# Patient Record
Sex: Male | Born: 1975 | Race: White | Hispanic: No | Marital: Married | Smoking: Former smoker
Health system: Southern US, Community
[De-identification: ages and names within clinical notes are randomized; demographics above are authoritative.]

## PROBLEM LIST (undated history)

## (undated) HISTORY — PX: WISDOM TOOTH EXTRACTION: SHX21

---

## 1998-11-19 ENCOUNTER — Emergency Department (HOSPITAL_COMMUNITY): Admission: EM | Admit: 1998-11-19 | Discharge: 1998-11-19 | Payer: Self-pay | Admitting: Emergency Medicine

## 1998-12-02 ENCOUNTER — Encounter: Admission: RE | Admit: 1998-12-02 | Discharge: 1998-12-02 | Payer: Self-pay | Admitting: *Deleted

## 2002-01-08 ENCOUNTER — Emergency Department (HOSPITAL_COMMUNITY): Admission: EM | Admit: 2002-01-08 | Discharge: 2002-01-08 | Payer: Self-pay | Admitting: Emergency Medicine

## 2004-09-27 ENCOUNTER — Emergency Department (HOSPITAL_COMMUNITY): Admission: EM | Admit: 2004-09-27 | Discharge: 2004-09-27 | Payer: Self-pay | Admitting: Family Medicine

## 2006-08-21 ENCOUNTER — Emergency Department (HOSPITAL_COMMUNITY): Admission: EM | Admit: 2006-08-21 | Discharge: 2006-08-21 | Payer: Self-pay | Admitting: Emergency Medicine

## 2008-02-06 ENCOUNTER — Emergency Department (HOSPITAL_COMMUNITY): Admission: EM | Admit: 2008-02-06 | Discharge: 2008-02-06 | Payer: Self-pay | Admitting: Emergency Medicine

## 2011-06-07 ENCOUNTER — Ambulatory Visit (INDEPENDENT_AMBULATORY_CARE_PROVIDER_SITE_OTHER): Payer: BC Managed Care – PPO | Admitting: Family Medicine

## 2011-06-07 ENCOUNTER — Encounter: Payer: Self-pay | Admitting: Family Medicine

## 2011-06-07 VITALS — BP 118/78 | HR 76 | Temp 98.0°F | Ht 70.75 in | Wt 225.0 lb

## 2011-06-07 DIAGNOSIS — Z0000 Encounter for general adult medical examination without abnormal findings: Secondary | ICD-10-CM

## 2011-06-07 LAB — CBC WITH DIFFERENTIAL/PLATELET
Basophils Absolute: 0 10*3/uL (ref 0.0–0.1)
Eosinophils Absolute: 0.1 10*3/uL (ref 0.0–0.7)
HCT: 47.4 % (ref 39.0–52.0)
Hemoglobin: 16.4 g/dL (ref 13.0–17.0)
Lymphs Abs: 1.7 10*3/uL (ref 0.7–4.0)
MCHC: 34.7 g/dL (ref 30.0–36.0)
Monocytes Relative: 4.9 % (ref 3.0–12.0)
Neutro Abs: 5.7 10*3/uL (ref 1.4–7.7)
Platelets: 182 10*3/uL (ref 150.0–400.0)
RDW: 13.1 % (ref 11.5–14.6)

## 2011-06-07 LAB — TSH: TSH: 0.9 u[IU]/mL (ref 0.35–5.50)

## 2011-06-07 LAB — POCT URINALYSIS DIPSTICK
Ketones, UA: NEGATIVE
Leukocytes, UA: NEGATIVE
Protein, UA: NEGATIVE
Urobilinogen, UA: 1
pH, UA: 7

## 2011-06-07 LAB — BASIC METABOLIC PANEL
BUN: 12 mg/dL (ref 6–23)
CO2: 27 mEq/L (ref 19–32)
Chloride: 106 mEq/L (ref 96–112)
Creatinine, Ser: 1 mg/dL (ref 0.4–1.5)
Glucose, Bld: 95 mg/dL (ref 70–99)
Potassium: 4.1 mEq/L (ref 3.5–5.1)

## 2011-06-07 LAB — LIPID PANEL
Cholesterol: 153 mg/dL (ref 0–200)
Triglycerides: 45 mg/dL (ref 0.0–149.0)

## 2011-06-07 LAB — HEPATIC FUNCTION PANEL
ALT: 45 U/L (ref 0–53)
AST: 30 U/L (ref 0–37)
Total Protein: 7.2 g/dL (ref 6.0–8.3)

## 2011-06-07 NOTE — Progress Notes (Signed)
  Subjective:    Patient ID: Hector Smith, male    DOB: 01/20/1976, 35 y.o.   MRN: [id number]  HPI 35 yr old male to establish  with [place] and for a cpx. He has several questions. First he has frequent numbness in both hands when he wakes up in the morning, and this then goes away after he gets moving. No swelling or pain or weakness. Second he has had mild intermittent sharp pains in the right lower back for 4 weeks. Aleve helps.    Review of Systems  Constitutional: Negative.   HENT: Negative.   Eyes: Negative.   Respiratory: Negative.   Cardiovascular: Negative.   Gastrointestinal: Negative.   Genitourinary: Negative.   Musculoskeletal: Positive for back pain. Negative for myalgias, joint swelling, arthralgias and gait problem.  Skin: Negative.   Neurological: Positive for numbness. Negative for dizziness, tremors, seizures, syncope, facial asymmetry, speech difficulty, weakness, light-headedness and headaches.  Hematological: Negative.   Psychiatric/Behavioral: Negative.        Objective:   Physical Exam  Constitutional: He is oriented to person, place, and time. He appears well-developed and well-nourished. No distress.  HENT:  Head: Normocephalic and atraumatic.  Right Ear: External ear normal.  Left Ear: External ear normal.  Nose: Nose normal.  Mouth/Throat: Oropharynx is clear and moist. No oropharyngeal exudate.  Eyes: Conjunctivae and EOM are normal. Pupils are equal, round, and reactive to light. Right eye exhibits no discharge. Left eye exhibits no discharge. No scleral icterus.  Neck: Neck supple. No JVD present. No tracheal deviation present. No thyromegaly present.  Cardiovascular: Normal rate, regular rhythm, normal heart sounds and intact distal pulses.  Exam reveals no gallop and no friction rub.   No murmur heard. Pulmonary/Chest: Effort normal and breath sounds normal. No respiratory distress. He has no wheezes. He has no rales. He exhibits no tenderness.    Abdominal: Soft. Bowel sounds are normal. He exhibits no distension and no mass. There is no tenderness. There is no rebound and no guarding.  Genitourinary: Rectum normal, prostate normal and penis normal. Guaiac negative stool. No penile tenderness.  Musculoskeletal: Normal range of motion. He exhibits no edema and no tenderness.  Lymphadenopathy:    He has no cervical adenopathy.  Neurological: He is alert and oriented to person, place, and time. He has normal reflexes. No cranial nerve deficit. He exhibits normal muscle tone. Coordination normal.  Skin: Skin is warm and dry. No rash noted. He is not diaphoretic. No erythema. No pallor.  Psychiatric: He has a normal mood and affect. His behavior is normal. Judgment and thought content normal.          Assessment & Plan:  Get fasting labs. He probably has some mild carpal tunnel syndrome.  Wear wrist splints at bedtime every night. Advised him to get more exercise and lose some weight, and this should help the back pain.

## 2011-06-10 ENCOUNTER — Telehealth: Payer: Self-pay | Admitting: Family Medicine

## 2011-06-10 NOTE — Telephone Encounter (Signed)
Message copied by Baldemar Friday on Fri Jun 10, 2011  4:14 PM ------      Message from: Gershon Crane A      Created: Wed Jun 08, 2011  5:56 PM       normal

## 2011-06-10 NOTE — Telephone Encounter (Signed)
Spoke with pt and gave results. 

## 2014-01-13 ENCOUNTER — Emergency Department (HOSPITAL_COMMUNITY)
Admission: EM | Admit: 2014-01-13 | Discharge: 2014-01-13 | Disposition: A | Discharge status: Home / Self Care | Payer: BC Managed Care – PPO | Source: Home / Self Care | Attending: Emergency Medicine | Admitting: Emergency Medicine

## 2014-01-13 ENCOUNTER — Encounter (HOSPITAL_COMMUNITY): Payer: Self-pay | Admitting: Emergency Medicine

## 2014-01-13 DIAGNOSIS — J069 Acute upper respiratory infection, unspecified: Secondary | ICD-10-CM

## 2014-01-13 MED ORDER — AZITHROMYCIN 250 MG PO TABS: ORAL_TABLET | ORAL | Status: DC

## 2014-01-13 MED ORDER — ALBUTEROL SULFATE HFA 108 (90 BASE) MCG/ACT IN AERS: 1.0000 | INHALATION_SPRAY | Freq: Four times a day (QID) | RESPIRATORY_TRACT | Status: DC | PRN

## 2014-01-13 MED ORDER — HYDROCOD POLST-CHLORPHEN POLST 10-8 MG/5ML PO LQCR: 5.0000 mL | Freq: Two times a day (BID) | ORAL | Status: DC | PRN

## 2014-01-13 MED ORDER — IPRATROPIUM BROMIDE 0.06 % NA SOLN: 2.0000 | Freq: Four times a day (QID) | NASAL | Status: DC

## 2014-01-13 NOTE — ED Provider Notes (Signed)
  Chief Complaint   Chief Complaint  Patient presents with  . URI    History of Present Illness   Hector Smith is a 38 year old male who has had a three-day history of dry cough, chest tightness, wheezing, chest pain when he coughs, trouble breathing, sweats, subjective fever, chills, nasal congestion with clear drainage, headache, ear congestion, sore throat, and loose stools. He denies any nausea or vomiting.  Review of Systems   Other than as noted above, the patient denies any of the following symptoms: Systemic:  No fevers, chills, sweats, or myalgias. Eye:  No redness or discharge. ENT:  No ear pain, headache, nasal congestion, drainage, sinus pressure, or sore throat. Neck:  No neck pain, stiffness, or swollen glands. Lungs:  No cough, sputum production, hemoptysis, wheezing, chest tightness, shortness of breath or chest pain. GI:  No abdominal pain, nausea, vomiting or diarrhea.  PMFSH   Past medical history, family history, social history, meds, and allergies were reviewed.   Physical exam   Vital signs:  BP 137/81  Pulse 86  Temp(Src) 98.9 F (37.2 C) (Oral)  Resp 16  SpO2 96% General:  Alert and oriented.  In no distress.  Skin warm and dry. Eye:  No conjunctival injection or drainage. Lids were normal. ENT:  TMs and canals were normal, without erythema or inflammation.  Nasal mucosa was clear and uncongested, without drainage.  Mucous membranes were moist.  Pharynx was clear with no exudate or drainage.  There were no oral ulcerations or lesions. Neck:  Supple, no adenopathy, tenderness or mass. Lungs:  No respiratory distress.  Lungs were clear to auscultation, without wheezes, rales or rhonchi.  Breath sounds were clear and equal bilaterally.  Heart:  Regular rhythm, without gallops, murmers or rubs. Skin:  Clear, warm, and dry, without rash or lesions.   Assessment     The encounter diagnosis was Viral upper respiratory infection.  Plan    1.  Meds:  The  following meds were prescribed:   Discharge Medication List as of 01/13/2014  6:17 PM    START taking these medications   Details  albuterol (PROVENTIL HFA;VENTOLIN HFA) 108 (90 BASE) MCG/ACT inhaler Inhale 1-2 puffs into the lungs every 6 (six) hours as needed for wheezing or shortness of breath., Starting 01/13/2014, Until Discontinued, Normal    chlorpheniramine-HYDROcodone (TUSSIONEX) 10-8 MG/5ML LQCR Take 5 mLs by mouth every 12 (twelve) hours as needed for cough., Starting 01/13/2014, Until Discontinued, Normal    ipratropium (ATROVENT) 0.06 % nasal spray Place 2 sprays into both nostrils 4 (four) times daily., Starting 01/13/2014, Until Discontinued, Normal    azithromycin (ZITHROMAX Z-PAK) 250 MG tablet Take as directed., Normal       The patient was told not to get the prescription for antibiotic filled unless his respiratory symptoms had persisted for more than 7 to 10 days.  2.  Patient Education/Counseling:  The patient was given appropriate handouts, self care instructions, and instructed in symptomatic relief.  Instructed to get extra fluids, rest, and use a cool mist vaporizer.    3.  Follow up:  The patient was told to follow up here if no better in 3 to 4 days, or sooner if becoming worse in any way, and given some red flag symptoms such as increasing fever, difficulty breathing, chest pain, or persistent vomiting which would prompt immediate return.  Follow up here as needed.      Reuben Likesavid C Levy Wellman, MD 01/13/14 [id number]

## 2014-01-13 NOTE — Discharge Instructions (Signed)
Do not get antibiotic filled unless no better in 2 to 3 days. ° ° °Most upper respiratory infections are caused by viruses and do not require antibiotics.  We try to save the antibiotics for when we really need them to prevent bacteria from developing resistance to them.  Here are a few hints about things that can be done at home to help get over an upper respiratory infection quicker: ° °Get extra sleep and extra fluids.  Get 7 to 9 hours of sleep per night and 6 to 8 glasses of water a day.  Getting extra sleep keeps the immune system from getting run down.  Most people with an upper respiratory infection are a little dehydrated.  The extra fluids also keep the secretions liquified and easier to deal with.  Also, get extra vitamin C.  4000 mg per day is the recommended dose. °For the aches, headache, and fever, acetaminophen or ibuprofen are helpful.  These can be alternated every 4 hours.  People with liver disease should avoid large amounts of acetaminophen, and people with ulcer disease, gastroesophageal reflux, gastritis, congestive heart failure, chronic kidney disease, coronary artery disease and the elderly should avoid ibuprofen. °For nasal congestion try Mucinex-D, or if you're having lots of sneezing or clear nasal drainage use Zyrtec-D. People with high blood pressure can take these if their blood pressure is controlled, if not, it's best to avoid the forms with a "D" (decongestants).  You can use the plain Mucinex, Allegra, Claritin, or Zyrtec even if your blood pressure is not controlled.   °A Saline nasal spray such as Ocean Spray can also help.  You can add a decongestant sprays such as Afrin, but you should not use the decongestant sprays for more than 3 or 4 days since they can be habituating.  Breathe Rite nasal strips can also offer a non-drug alternative treatment to nasal congestion, especially at night. °For people with symptoms of sinusitis, sleeping with your head elevated can be helpful.   For sinus pain, moist, hot compresses to the face may provide some relief.  Many people find that inhaling steam as in a shower or from a pot of steaming water can help. °For any viral infection, zinc containing lozenges such as Cold-Eze or Zicam are helpful.  Zinc helps to fight viral infection.  Hot salt water gargles (8 oz of hot water, 1/2 tsp of table salt, and a pinch of baking soda) can give relief as well as hot beverages such as hot tea.  Sucrets extra strength lozenges will help the sore throat.  °For the cough, take Delsym 2 tsp every 12 hours.  It has also been found recently that Aleve can help control a cough.  The dose is 1 to 2 tablets twice daily with food.  This can be combined with Delsym. (Note, if you are taking ibuprofen, you should not take Aleve as well--take one or the other.) °A cool mist vaporizer will help keep your mucous membranes from drying out.  ° °It's important when you have an upper respiratory infection not to pass the infection to others.  This involves being very careful about the following: ° °Frequent hand washing or use of hand sanitizer, especially after coughing, sneezing, blowing your nose or touching your face, nose or eyes. °Do not shake hands or touch anyone and try to avoid touching surfaces that other people use such as doorknobs, shopping carts, telephones and computer keyboards. °Use tissues and dispose of them properly in a   garbage can or ziplock bag. °Cough into your sleeve. °Do not let others eat or drink after you. ° °It's also important to recognize the signs of serious illness and get evaluated if they occur: °Any respiratory infection that lasts more than 7 to 10 days.  Yellow nasal drainage and sputum are not reliable indicators of a bacterial infection, but if they last for more than 1 week, see your doctor. °Fever and sore throat can indicate strep. °Fever and cough can indicate influenza or pneumonia. °Any kind of severe symptom such as difficulty  breathing, intractable vomiting, or severe pain should prompt you to see a doctor as soon as possible. ° ° °Your body's immune system is really the thing that will get rid of this infection.  Your immune system is comprised of 2 types of specialized cells called T cells and B cells.  T cells coordinate the array of cells in your body that engulf invading bacteria or viruses while B cells orchestrate the production of antibodies that neutralize infection.  Anything we do or any medications we give you, will just strengthen your immune system or help it clear up the infection quicker.  Here are a few helpful hints to improve your immune system to help overcome this illness or to prevent future infections: °· A few vitamins can improve the health of your immune system.  That's why your diet should include plenty of fruits, vegetables, fish, nuts, and whole grains. °· Vitamin A and bet-carotene can increase the cells that fight infections (T cells and B cells).  Vitamin A is abundant in dark greens and orange vegetables such as spinach, greens, sweet potatoes, and carrots. °· Vitamin B6 contributes to the maturation of white blood cells, the cells that fight disease.  Foods with vitamin B6 include cold cereal and bananas. °· Vitamin C is credited with preventing colds because it increases white blood cells and also prevents cellular damage.  Citrus fruits, peaches and green and red bell peppers are all hight in vitamin C. °· Vitamin E is an anti-oxidant that encourages the production of natural killer cells which reject foreign invaders and B cells that produce antibodies.  Foods high in vitamin E include wheat germ, nuts and seeds. °· Foods high in omega-3 fatty acids found in foods like salmon, tuna and mackerel boost your immune system and help cells to engulf and absorb germs. °· Probiotics are good bacteria that increase your T cells.  These can be found in yogurt and are available in supplements such as Culturelle  or Align. °· Moderate exercise increases the strength of your immune system and your ability to recover from illness.  I suggest 3 to 5 moderate intensity 30 minute workouts per week.   °· Sleep is another component of maintaining a strong immune system.  It enables your body to recuperate from the day's activities, stress and work.  My recommendation is to get between 7 and 9 hours of sleep per night. °· If you smoke, try to quit completely or at least cut down.  Drink alcohol only in moderation if at all.  No more than 2 drinks daily for men or 1 for women. °· Get a flu vaccine early in the fall or if you have not gotten one yet, once this illness has run its course.  If you are over 65, a smoker, or an asthmatic, get a pneumococcal vaccine. °· My final recommendation is to maintain a healthy weight.  Excess weight can impair the   immune system by interfering with the way the immune system deals with invading viruses or bacteria. ° ° °

## 2014-01-13 NOTE — ED Notes (Signed)
C/o weak, cough, chest congestion, and fever onset 3 days ago.

## 2014-05-19 ENCOUNTER — Telehealth: Payer: Self-pay | Admitting: Family Medicine

## 2014-05-19 NOTE — Telephone Encounter (Signed)
appt scheduled

## 2014-05-19 NOTE — Telephone Encounter (Signed)
lmvm for pt to cb.

## 2014-05-19 NOTE — Telephone Encounter (Signed)
Pt not seen since 06/07/2011 so he made the cutoff by 17 days. Pt needs cpe by 06/17/14 for his employer. First available is 9/9. Is it OK to work in?

## 2014-05-19 NOTE — Telephone Encounter (Signed)
Okay per Dr. Clent RidgesFry to work in.

## 2014-05-29 ENCOUNTER — Other Ambulatory Visit (INDEPENDENT_AMBULATORY_CARE_PROVIDER_SITE_OTHER): Payer: BC Managed Care – PPO

## 2014-05-29 ENCOUNTER — Ambulatory Visit (INDEPENDENT_AMBULATORY_CARE_PROVIDER_SITE_OTHER): Payer: BC Managed Care – PPO

## 2014-05-29 ENCOUNTER — Ambulatory Visit (INDEPENDENT_AMBULATORY_CARE_PROVIDER_SITE_OTHER): Payer: BC Managed Care – PPO | Admitting: Podiatry

## 2014-05-29 ENCOUNTER — Encounter: Payer: Self-pay | Admitting: Podiatry

## 2014-05-29 VITALS — BP 157/99 | HR 77 | Resp 17

## 2014-05-29 DIAGNOSIS — R52 Pain, unspecified: Secondary | ICD-10-CM

## 2014-05-29 DIAGNOSIS — Z0000 Encounter for general adult medical examination without abnormal findings: Secondary | ICD-10-CM

## 2014-05-29 DIAGNOSIS — M109 Gout, unspecified: Secondary | ICD-10-CM

## 2014-05-29 LAB — CBC WITH DIFFERENTIAL/PLATELET
BASOS ABS: 0 10*3/uL (ref 0.0–0.1)
Basophils Relative: 0.3 % (ref 0.0–3.0)
EOS ABS: 0.4 10*3/uL (ref 0.0–0.7)
EOS PCT: 5.5 % — AB (ref 0.0–5.0)
HCT: 45.7 % (ref 39.0–52.0)
Hemoglobin: 15.5 g/dL (ref 13.0–17.0)
Lymphocytes Relative: 25.3 % (ref 12.0–46.0)
Lymphs Abs: 1.7 10*3/uL (ref 0.7–4.0)
MCHC: 33.9 g/dL (ref 30.0–36.0)
MCV: 87.4 fl (ref 78.0–100.0)
MONO ABS: 0.2 10*3/uL (ref 0.1–1.0)
Monocytes Relative: 2.9 % — ABNORMAL LOW (ref 3.0–12.0)
NEUTROS PCT: 66 % (ref 43.0–77.0)
Neutro Abs: 4.4 10*3/uL (ref 1.4–7.7)
PLATELETS: 226 10*3/uL (ref 150.0–400.0)
RBC: 5.22 Mil/uL (ref 4.22–5.81)
RDW: 13.2 % (ref 11.5–15.5)
WBC: 6.7 10*3/uL (ref 4.0–10.5)

## 2014-05-29 LAB — POCT URINALYSIS DIPSTICK
BILIRUBIN UA: NEGATIVE
Blood, UA: NEGATIVE
GLUCOSE UA: NEGATIVE
KETONES UA: NEGATIVE
LEUKOCYTES UA: NEGATIVE
Nitrite, UA: NEGATIVE
Spec Grav, UA: 1.02
Urobilinogen, UA: 0.2
pH, UA: 7

## 2014-05-29 LAB — C-REACTIVE PROTEIN: CRP: 1 mg/dL — ABNORMAL HIGH (ref <0.60)

## 2014-05-29 LAB — HEPATIC FUNCTION PANEL
ALT: 44 U/L (ref 0–53)
AST: 29 U/L (ref 0–37)
Albumin: 3.9 g/dL (ref 3.5–5.2)
Alkaline Phosphatase: 39 U/L (ref 39–117)
BILIRUBIN DIRECT: 0 mg/dL (ref 0.0–0.3)
BILIRUBIN TOTAL: 0.6 mg/dL (ref 0.2–1.2)
Total Protein: 7.2 g/dL (ref 6.0–8.3)

## 2014-05-29 LAB — LIPID PANEL
CHOLESTEROL: 171 mg/dL (ref 0–200)
HDL: 42.1 mg/dL (ref >39.00)
LDL Cholesterol: 114 mg/dL — ABNORMAL HIGH (ref 0–99)
NonHDL: 128.9
Total CHOL/HDL Ratio: 4
Triglycerides: 73 mg/dL (ref 0.0–149.0)
VLDL: 14.6 mg/dL (ref 0.0–40.0)

## 2014-05-29 LAB — BASIC METABOLIC PANEL
BUN: 16 mg/dL (ref 6–23)
CHLORIDE: 104 meq/L (ref 96–112)
CO2: 28 mEq/L (ref 19–32)
CREATININE: 0.8 mg/dL (ref 0.4–1.5)
Calcium: 9.1 mg/dL (ref 8.4–10.5)
GFR: 109.47 mL/min (ref >60.00)
GLUCOSE: 87 mg/dL (ref 70–99)
Potassium: 3.6 mEq/L (ref 3.5–5.1)
Sodium: 140 mEq/L (ref 135–145)

## 2014-05-29 LAB — URIC ACID: Uric Acid, Serum: 6 mg/dL (ref 4.0–7.8)

## 2014-05-29 LAB — RHEUMATOID FACTOR

## 2014-05-29 LAB — TSH: TSH: 0.29 u[IU]/mL — ABNORMAL LOW (ref 0.35–4.50)

## 2014-05-29 LAB — SEDIMENTATION RATE: SED RATE: 9 mm/h (ref 0–16)

## 2014-05-29 MED ORDER — PREDNISONE 10 MG PO TABS: ORAL_TABLET | ORAL | Status: DC

## 2014-05-29 NOTE — Progress Notes (Signed)
Subjective:     Patient ID: Hector Smith, male   DOB: 11/14/1975, 38 y.o.   MRN: [id number]  HPI patient states she's had a lot of itching of his feet without changes in his and seems to move from foot to foot was swelling but also occurs and also he knows she is flat-footed   Review of Systems  All other systems reviewed and are negative.      Objective:   Physical Exam  Nursing note and vitals reviewed. Constitutional: He is oriented to person, place, and time.  Cardiovascular: Intact distal pulses.   Musculoskeletal: Normal range of motion.  Neurological: He is oriented to person, place, and time.  Skin: Skin is warm.   neurovascular status intact with muscle strength adequate and range of motion subtalar midtarsal joint within normal limits. Patient is not found to have significant discomfort currently with flatfoot deformity noted and digits that are well perfused. I inspected his feet and did not note any skin color changes at the current time     Assessment:     Possibility for some form of inflammatory reaction versus possibility for condition secondary to foot structure or systemic disease    Plan:     H&P and x-rays reviewed. Placed on 12 a steroid radius Dosepak and sent for arthritic profile to rule out inflammatory disease. If we see anything we will call him and we will monitor response to the Medrol Dosepak and decide if it's appropriate to consider other treatment pattern

## 2014-05-29 NOTE — Progress Notes (Signed)
   Subjective:    Patient ID: Hector Smith, male    DOB: 08/24/1976, 38 y.o.   MRN: [id number]  HPI  Pt presents with bilateral itching, pain and swelling in feet. Itching is on tops and bottom of feet, itching comes with swelling. Uses cold water and ice to alleviate. Feet get worse in the afternoon, with pain and makes him feel as if he cannot walk  Review of Systems  All other systems reviewed and are negative.      Objective:   Physical Exam        Assessment & Plan:

## 2014-05-30 LAB — ANA: Anti Nuclear Antibody(ANA): NEGATIVE

## 2014-06-06 ENCOUNTER — Encounter: Payer: Self-pay | Admitting: Family Medicine

## 2014-06-06 ENCOUNTER — Ambulatory Visit (INDEPENDENT_AMBULATORY_CARE_PROVIDER_SITE_OTHER): Payer: BC Managed Care – PPO | Admitting: Family Medicine

## 2014-06-06 VITALS — BP 138/80 | Temp 98.0°F | Ht 71.5 in | Wt 242.0 lb

## 2014-06-06 DIAGNOSIS — Z0000 Encounter for general adult medical examination without abnormal findings: Secondary | ICD-10-CM

## 2014-06-06 MED ORDER — TRIAMCINOLONE ACETONIDE 0.1 % EX CREA: 1.0000 "application " | TOPICAL_CREAM | Freq: Two times a day (BID) | CUTANEOUS | Status: DC

## 2014-06-06 NOTE — Progress Notes (Signed)
   Subjective:    Patient ID: Hector Smith, male    DOB: 09/24/1976, 38 y.o.   MRN: [id number]  HPI  38 yr old male for a cpx. He feels well except for intermittent itching in the tops of his feet and the shins. Sometimes he has red rashes in these areas, sometimes not. He had some swelling and pain in the feet a few months ago and he saw Podiatry for this. He had arthritis type labs done which were unremarkable. He was put on an oral steroid taper and this has helped.    Review of Systems  Constitutional: Negative.   HENT: Negative.   Eyes: Negative.   Respiratory: Negative.   Cardiovascular: Negative.   Gastrointestinal: Negative.   Genitourinary: Negative.   Musculoskeletal: Negative.   Skin: Negative.   Neurological: Negative.   Psychiatric/Behavioral: Negative.        Objective:   Physical Exam  Constitutional: He is oriented to person, place, and time. He appears well-developed and well-nourished. No distress.  HENT:  Head: Normocephalic and atraumatic.  Right Ear: External ear normal.  Left Ear: External ear normal.  Nose: Nose normal.  Mouth/Throat: Oropharynx is clear and moist. No oropharyngeal exudate.  Eyes: Conjunctivae and EOM are normal. Pupils are equal, round, and reactive to light. Right eye exhibits no discharge. Left eye exhibits no discharge. No scleral icterus.  Neck: Neck supple. No JVD present. No tracheal deviation present. No thyromegaly present.  Cardiovascular: Normal rate, regular rhythm, normal heart sounds and intact distal pulses.  Exam reveals no gallop and no friction rub.   No murmur heard. Pulmonary/Chest: Effort normal and breath sounds normal. No respiratory distress. He has no wheezes. He has no rales. He exhibits no tenderness.  Abdominal: Soft. Bowel sounds are normal. He exhibits no distension and no mass. There is no tenderness. There is no rebound and no guarding.  Genitourinary: Rectum normal, prostate normal and penis normal. Guaiac  negative stool. No penile tenderness.  Musculoskeletal: Normal range of motion. He exhibits no edema and no tenderness.  Lymphadenopathy:    He has no cervical adenopathy.  Neurological: He is alert and oriented to person, place, and time. He has normal reflexes. No cranial nerve deficit. He exhibits normal muscle tone. Coordination normal.  Skin: Skin is warm and dry. He is not diaphoretic. No erythema. No pallor.  The dorsal feet are clear but he has a few small scabbed areas on the shins   Psychiatric: He has a normal mood and affect. His behavior is normal. Judgment and thought content normal.          Assessment & Plan:  Well exam. He has some eczema and we will treat this with triamcinolone cream.

## 2014-06-06 NOTE — Progress Notes (Signed)
Pre visit review using our clinic review tool, if applicable. No additional management support is needed unless otherwise documented below in the visit note. 

## 2015-07-02 ENCOUNTER — Encounter: Payer: Self-pay | Admitting: Family Medicine

## 2015-07-02 ENCOUNTER — Ambulatory Visit (INDEPENDENT_AMBULATORY_CARE_PROVIDER_SITE_OTHER): Payer: BLUE CROSS/BLUE SHIELD | Admitting: Family Medicine

## 2015-07-02 VITALS — BP 125/73 | HR 73 | Temp 98.1°F | Ht 71.5 in | Wt 242.0 lb

## 2015-07-02 DIAGNOSIS — J209 Acute bronchitis, unspecified: Secondary | ICD-10-CM

## 2015-07-02 MED ORDER — HYDROCODONE-HOMATROPINE 5-1.5 MG/5ML PO SYRP: 5.0000 mL | ORAL_SOLUTION | ORAL | Status: DC | PRN

## 2015-07-02 MED ORDER — CLARITHROMYCIN 500 MG PO TABS: 500.0000 mg | ORAL_TABLET | Freq: Two times a day (BID) | ORAL | Status: DC

## 2015-07-02 NOTE — Progress Notes (Signed)
Pre visit review using our clinic review tool, if applicable. No additional management support is needed unless otherwise documented below in the visit note. 

## 2015-07-02 NOTE — Progress Notes (Signed)
   Subjective:    Patient ID: Hector Smith, male    DOB: 1976/04/22, 39 y.o.   MRN: [id number]  HPI Here for 3 weeks of chest tightness and a dry cough. No fever. Using Mucinex.    Review of Systems  Constitutional: Negative.   HENT: Positive for congestion. Negative for postnasal drip and sinus pressure.   Eyes: Negative.   Respiratory: Positive for cough and chest tightness. Negative for shortness of breath and wheezing.   Cardiovascular: Negative.        Objective:   Physical Exam  Constitutional: He appears well-developed and well-nourished.  HENT:  Right Ear: External ear normal.  Left Ear: External ear normal.  Nose: Nose normal.  Mouth/Throat: Oropharynx is clear and moist.  Eyes: Conjunctivae are normal.  Neck: No thyromegaly present.  Cardiovascular: Normal rate, regular rhythm, normal heart sounds and intact distal pulses.   Pulmonary/Chest: Effort normal. No respiratory distress. He has no wheezes. He has no rales.  Scattered rhonchi   Lymphadenopathy:    He has no cervical adenopathy.          Assessment & Plan:  Bronchitis, treat with Biaxin

## 2015-07-10 ENCOUNTER — Telehealth: Payer: Self-pay | Admitting: Family Medicine

## 2015-07-10 MED ORDER — AMOXICILLIN-POT CLAVULANATE 875-125 MG PO TABS: 1.0000 | ORAL_TABLET | Freq: Two times a day (BID) | ORAL | Status: DC

## 2015-07-10 MED ORDER — HYDROCODONE-HOMATROPINE 5-1.5 MG/5ML PO SYRP: 5.0000 mL | ORAL_SOLUTION | ORAL | Status: DC | PRN

## 2015-07-10 NOTE — Telephone Encounter (Signed)
Pt seen last week , and pt states last night it all seemed to come back.. Pt continually  Coughing, especially when he lays down, pt states it feel like something in his upper chest and he cannot get out. Pt has no more cough syrup, and 3 pills left that is 2 x /day. Would like to know if you may prescribe additional abx. And or cough med. [facility] [address]

## 2015-07-10 NOTE — Telephone Encounter (Signed)
We will treat him with Augmentin

## 2015-07-10 NOTE — Telephone Encounter (Signed)
Scripts are ready for pick up and I spoke with pt. 

## 2019-07-13 ENCOUNTER — Encounter (HOSPITAL_COMMUNITY): Payer: Self-pay

## 2019-07-13 ENCOUNTER — Ambulatory Visit (HOSPITAL_COMMUNITY)
Admission: EM | Admit: 2019-07-13 | Discharge: 2019-07-13 | Disposition: A | Discharge status: Home / Self Care | Payer: BC Managed Care – PPO | Attending: Emergency Medicine | Admitting: Emergency Medicine

## 2019-07-13 DIAGNOSIS — H6123 Impacted cerumen, bilateral: Secondary | ICD-10-CM | POA: Diagnosis not present

## 2019-07-13 NOTE — Discharge Instructions (Signed)
Your earwax was removed today.  It does not appear that you have an ear infection.    Follow-up with your primary care provider or return here if your ear pain continues or worsens.

## 2019-07-13 NOTE — ED Triage Notes (Signed)
Pt present right ear pain, symptoms started 3-4 days ago. Pt state that his right ear hurt closer to the opening part of the ear.

## 2019-07-13 NOTE — ED Provider Notes (Addendum)
[facility]    CSN: [id number] Arrival date & time: 07/13/19  1338      History   Chief Complaint Chief Complaint  Patient presents with  . Otalgia    Right ear    HPI Hector Smith is a 43 y.o. male.   Patient presents with right ear pain x3 days.  He states both his ears feel stopped up.  He denies fever, chills, sore throat, cough, shortness of breath, or other symptoms.  The history is provided by the patient.    History reviewed. No pertinent past medical history.  There are no active problems to display for this patient.   Past Surgical History:  Procedure Laterality Date  . WISDOM TOOTH EXTRACTION         Home Medications    Prior to Admission medications   Medication Sig Start Date End Date Taking? Authorizing Provider  amoxicillin-clavulanate (AUGMENTIN) 875-125 MG per tablet Take 1 tablet by mouth 2 (two) times daily. 07/10/15   Nelwyn Salisbury, MD  clarithromycin (BIAXIN) 500 MG tablet Take 1 tablet (500 mg total) by mouth 2 (two) times daily. 07/02/15   Nelwyn Salisbury, MD  HYDROcodone-homatropine (HYDROMET) 5-1.5 MG/5ML syrup Take 5 mLs by mouth every 4 (four) hours as needed. 07/10/15   Nelwyn Salisbury, MD    Family History Family History  Problem Relation Age of Onset  . Heart disease Father   . Diabetes Maternal Grandfather     Social History Social History   Tobacco Use  . Smoking status: Former Smoker    Packs/day: 1.00    Years: 15.00    Pack years: 15.00    Types: Cigarettes    Quit date: 06/06/2013    Years since quitting: 6.1  . Smokeless tobacco: Never Used  Substance Use Topics  . Alcohol use: No    Alcohol/week: 0.0 standard drinks  . Drug use: No     Allergies   Patient has no known allergies.   Review of Systems Review of Systems  Constitutional: Negative for chills and fever.  HENT: Positive for ear pain. Negative for congestion, rhinorrhea and sore throat.   Eyes: Negative for pain and visual disturbance.   Respiratory: Negative for cough and shortness of breath.   Cardiovascular: Negative for chest pain and palpitations.  Gastrointestinal: Negative for abdominal pain and vomiting.  Genitourinary: Negative for dysuria and hematuria.  Musculoskeletal: Negative for arthralgias and back pain.  Skin: Negative for color change and rash.  Neurological: Negative for seizures and syncope.  All other systems reviewed and are negative.    Physical Exam Triage Vital Signs ED Triage Vitals  Enc Vitals Group     BP 07/13/19 1425 126/84     Pulse Rate 07/13/19 1425 75     Resp 07/13/19 1425 16     Temp 07/13/19 1425 98.9 F (37.2 C)     Temp Source 07/13/19 1425 Oral     SpO2 --      Weight --      Height --      Head Circumference --      Peak Flow --      Pain Score 07/13/19 1428 5     Pain Loc --      Pain Edu? --      Excl. in GC? --    No data found.  Updated Vital Signs BP 126/84 (BP Location: Right Arm)   Pulse 75   Temp 98.9 F (37.2 C) (  Oral)   Resp 16   Visual Acuity Right Eye Distance:   Left Eye Distance:   Bilateral Distance:    Right Eye Near:   Left Eye Near:    Bilateral Near:     Physical Exam Vitals signs and nursing note reviewed.  Constitutional:      Appearance: He is well-developed.  HENT:     Head: Normocephalic and atraumatic.     Right Ear: There is impacted cerumen.     Left Ear: There is impacted cerumen.     Nose: Nose normal.     Mouth/Throat:     Mouth: Mucous membranes are moist.     Pharynx: Oropharynx is clear.  Eyes:     Conjunctiva/sclera: Conjunctivae normal.  Neck:     Musculoskeletal: Neck supple.  Cardiovascular:     Rate and Rhythm: Normal rate and regular rhythm.     Heart sounds: No murmur.  Pulmonary:     Effort: Pulmonary effort is normal. No respiratory distress.     Breath sounds: Normal breath sounds.  Abdominal:     Palpations: Abdomen is soft.     Tenderness: There is no abdominal tenderness. There is no  guarding or rebound.  Skin:    General: Skin is warm and dry.     Findings: No rash.  Neurological:     Mental Status: He is alert.      UC Treatments / Results  Labs (all labs ordered are listed, but only abnormal results are displayed) Labs Reviewed - No data to display  EKG   Radiology No results found.  Procedures Procedures (including critical care time)  Medications Ordered in UC Medications - No data to display  Initial Impression / Assessment and Plan / UC Course  I have reviewed the triage vital signs and the nursing notes.  Pertinent labs & imaging results that were available during my care of the patient were reviewed by me and considered in my medical decision making (see chart for details).   Bilateral cerumen impaction.  Impaction removed bilaterally via irrigation performed by nurse.  Patient reports symptoms improved after removal.  Instructed him to take Tylenol or ibuprofen as needed for discomfort.  Instructed him to return here or follow-up with his PCP if his ear pain continues or worsens.  Patient agrees to plan of care.       Final Clinical Impressions(s) / UC Diagnoses   Final diagnoses:  Bilateral impacted cerumen     Discharge Instructions     Your earwax was removed today.  It does not appear that you have an ear infection.    Follow-up with your primary care provider or return here if your ear pain continues or worsens.        ED Prescriptions    None     PDMP not reviewed this encounter.   Sharion Balloon, NP 07/13/19 1527    Sharion Balloon, NP 07/13/19 [phone]

## 2019-07-22 ENCOUNTER — Other Ambulatory Visit: Payer: Self-pay

## 2019-07-22 ENCOUNTER — Ambulatory Visit: Payer: BC Managed Care – PPO | Admitting: Family Medicine

## 2019-07-22 ENCOUNTER — Encounter: Payer: Self-pay | Admitting: Family Medicine

## 2019-07-22 VITALS — BP 140/82 | HR 88 | Temp 98.3°F | Ht 71.5 in | Wt 281.0 lb

## 2019-07-22 DIAGNOSIS — L2389 Allergic contact dermatitis due to other agents: Secondary | ICD-10-CM

## 2019-07-22 MED ORDER — TRIAMCINOLONE ACETONIDE 0.1 % EX CREA: 1.0000 "application " | TOPICAL_CREAM | Freq: Two times a day (BID) | CUTANEOUS | 2 refills | Status: DC

## 2019-07-22 NOTE — Progress Notes (Signed)
   Subjective:    Patient ID: Hector Smith, male    DOB: 1976/05/25, 43 y.o.   MRN: [id number]  HPI Here for one year of intermittent itchy rashes on the calves at the level of the tops of his socks. It he scratches these areas they swell a bit and get red. No rashes further down th leg or foot. He uses hypoallergenic detergents and soaps.    Review of Systems  Constitutional: Negative.   Respiratory: Negative.   Cardiovascular: Negative.   Skin: Positive for rash.       Objective:   Physical Exam Constitutional:      Appearance: Normal appearance.  Cardiovascular:     Rate and Rhythm: Normal rate and regular rhythm.     Pulses: Normal pulses.     Heart sounds: Normal heart sounds.  Pulmonary:     Effort: Pulmonary effort is normal.     Breath sounds: Normal breath sounds.  Skin:    Comments: Both lower legs have linear red rings at the level of the tops of his socks   Neurological:     Mental Status: He is alert.           Assessment & Plan:  Contact dermatitis from the elastic in his socks. He can use Triamcinolone cream prn. I suggested he wear footies rather than full length socks.  Alysia Penna, MD

## 2020-10-21 ENCOUNTER — Ambulatory Visit (HOSPITAL_COMMUNITY)
Admission: EM | Admit: 2020-10-21 | Discharge: 2020-10-21 | Disposition: A | Discharge status: Home / Self Care | Payer: BC Managed Care – PPO | Attending: Family Medicine | Admitting: Family Medicine

## 2020-10-21 ENCOUNTER — Encounter (HOSPITAL_COMMUNITY): Payer: Self-pay

## 2020-10-21 ENCOUNTER — Ambulatory Visit (INDEPENDENT_AMBULATORY_CARE_PROVIDER_SITE_OTHER): Payer: BC Managed Care – PPO

## 2020-10-21 DIAGNOSIS — R059 Cough, unspecified: Secondary | ICD-10-CM | POA: Insufficient documentation

## 2020-10-21 DIAGNOSIS — J129 Viral pneumonia, unspecified: Secondary | ICD-10-CM | POA: Diagnosis present

## 2020-10-21 DIAGNOSIS — R0602 Shortness of breath: Secondary | ICD-10-CM | POA: Diagnosis present

## 2020-10-21 DIAGNOSIS — U071 COVID-19: Secondary | ICD-10-CM | POA: Insufficient documentation

## 2020-10-21 DIAGNOSIS — R079 Chest pain, unspecified: Secondary | ICD-10-CM | POA: Diagnosis not present

## 2020-10-21 DIAGNOSIS — Z20822 Contact with and (suspected) exposure to covid-19: Secondary | ICD-10-CM | POA: Diagnosis not present

## 2020-10-21 DIAGNOSIS — R0981 Nasal congestion: Secondary | ICD-10-CM

## 2020-10-21 DIAGNOSIS — R531 Weakness: Secondary | ICD-10-CM

## 2020-10-21 LAB — SARS CORONAVIRUS 2 (TAT 6-24 HRS): SARS Coronavirus 2: POSITIVE — AB

## 2020-10-21 MED ORDER — PREDNISONE 20 MG PO TABS: 40.0000 mg | ORAL_TABLET | Freq: Every day | ORAL | 0 refills | Status: DC

## 2020-10-21 NOTE — ED Provider Notes (Signed)
[facility]   [id number] 10/21/20 Arrival Time: 0859  ASSESSMENT & PLAN:  1. Close exposure to COVID-19 virus   2. Cough   3. SOB (shortness of breath)   4. Pneumonia, viral     I have personally viewed the imaging studies ordered this visit. Bilateral patchy infiltrates.  COVID-19 testing sent. See letter/work note on file for self-isolation guidelines. OTC symptom care as needed.  Discussed CXR results. I suspect he has had COVID with family members positive last week. No respiratory distress. SpO2 stable at 95%. With mild wheezes on exam will begin trial of prednisone as below. Agrees to ED evaluation should he worsen in any way.  Meds ordered this encounter  Medications  . predniSONE (DELTASONE) 20 MG tablet    Sig: Take 2 tablets (40 mg total) by mouth daily.    Dispense:  10 tablet    Refill:  0     Follow-up Information    [facility].   Specialty: Emergency Medicine Why: If symptoms worsen in any way. Contact information: [address] [phone] mc [place] [address] [phone]              Reviewed expectations re: course of current medical issues. Questions answered. Outlined signs and symptoms indicating need for more acute intervention. Understanding verbalized. After Visit Summary given.   SUBJECTIVE: History from: patient. Hector Smith is a 45 y.o. male who presents with worries regarding COVID-19. Known COVID-19 contact: family members last week. Recent travel: none. Reports: non-prod cough, fatigue, body aches, ST; "feel very weak". Reports "chest congestion" getting worse. Subj fever a few days ago; last week up to 104 F. No current SOB. No assoc CP. Tolerating PO intake but overall decreased.  Social History   Tobacco Use  Smoking Status Former Smoker  . Packs/day: 1.00  . Years: 15.00  . Pack years: 15.00  . Types: Cigarettes  . Quit date: 06/06/2013  . Years  since quitting: 7.3  Smokeless Tobacco Never Used      OBJECTIVE:  Vitals:   10/21/20 1055 10/21/20 1056 10/21/20 1106  BP: (!) 173/96    Pulse: (!) 108    Resp: (!) 24    Temp: 99 F (37.2 C)    TempSrc: Oral    SpO2: 95%  95%  Weight:  127 kg   Height:  5\' 11"  (1.803 m)     Slight tachycardia noted. Is tachypneic.  General appearance: alert; no distress; appears fatigued Eyes: PERRLA; EOMI; conjunctiva normal HENT: [place]; AT; with nasal congestion Neck: supple  Lungs: speaks full sentences without difficulty; unlabored; mild bilat exp wheezing present Extremities: no edema Skin: warm and dry Neurologic: normal gait Psychological: alert and cooperative; normal mood and affect  Labs:  Labs Reviewed  SARS CORONAVIRUS 2 (TAT 6-24 HRS)    Imaging: DG Chest 2 View  Result Date: 10/21/2020 CLINICAL DATA:  Cough, congestion, and weakness since Christmas. EXAM: CHEST - 2 VIEW COMPARISON:  None. FINDINGS: The heart size and mediastinal contours are within normal limits. Normal pulmonary vascularity. Mild interstitial opacity in the left lower lobe and inferior right upper lobe. No focal consolidation, pleural effusion, or pneumothorax. No acute osseous abnormality. IMPRESSION: 1. Mild interstitial opacity in the left lower lobe and inferior right upper lobe, suspicious for atypical infection. Electronically Signed   By: 05-09-1973 M.D.   On: 10/21/2020 11:22    No Known Allergies  History reviewed. No pertinent past medical history.  Social History   Socioeconomic History  . Marital status: Married    Spouse name: Not on file  . Number of children: Not on file  . Years of education: Not on file  . Highest education level: Not on file  Occupational History  . Not on file  Tobacco Use  . Smoking status: Former Smoker    Packs/day: 1.00    Years: 15.00    Pack years: 15.00    Types: Cigarettes    Quit date: 06/06/2013    Years since quitting: 7.3  . Smokeless  tobacco: Never Used  Substance and Sexual Activity  . Alcohol use: No    Alcohol/week: 0.0 standard drinks  . Drug use: No  . Sexual activity: Not on file  Other Topics Concern  . Not on file  Social History Narrative  . Not on file   Social Determinants of Health   Financial Resource Strain: Not on file  Food Insecurity: Not on file  Transportation Needs: Not on file  Physical Activity: Not on file  Stress: Not on file  Social Connections: Not on file  Intimate Partner Violence: Not on file   Family History  Problem Relation Age of Onset  . Heart disease Father   . Diabetes Maternal Grandfather    Past Surgical History:  Procedure Laterality Date  . WISDOM TOOTH EXTRACTION       Mardella Layman, MD 10/21/20 1257

## 2020-10-21 NOTE — Discharge Instructions (Addendum)
You have been tested for COVID-19 today. °If your test returns positive, you will receive a phone call from [facility] regarding your results. °Negative test results are not called. °Both positive and negative results area always visible on MyChart. °If you do not have a MyChart account, sign up instructions are provided in your discharge papers. °Please do not hesitate to contact us should you have questions or concerns. ° °

## 2020-10-21 NOTE — ED Triage Notes (Signed)
Pt c/o non-productive cough, congestion, sore throat, general weakness onset 12/25. Here today b/c weakness has worsened "can't hardly get out of bed". Also reports "chest congestion" pain to mid upper back. Describes CP as something sitting on chest  Reports fever with Tmax of 104 last week, now resolved.  Also c/o lightheadedness with sweating when standing, moving, nausea today and yesterday with dry heaves. EKG performed and given to Dr. Tracie Harrier who advised pt can be evaluated here. CXR ordered.

## 2021-04-05 ENCOUNTER — Other Ambulatory Visit: Payer: Self-pay

## 2021-04-05 ENCOUNTER — Ambulatory Visit (INDEPENDENT_AMBULATORY_CARE_PROVIDER_SITE_OTHER): Payer: BC Managed Care – PPO | Admitting: Family Medicine

## 2021-04-05 ENCOUNTER — Encounter: Payer: Self-pay | Admitting: Family Medicine

## 2021-04-05 VITALS — BP 130/94 | HR 94 | Temp 98.7°F | Ht 70.5 in | Wt 298.1 lb

## 2021-04-05 DIAGNOSIS — Z0000 Encounter for general adult medical examination without abnormal findings: Secondary | ICD-10-CM

## 2021-04-05 MED ORDER — SILDENAFIL CITRATE 100 MG PO TABS: 100.0000 mg | ORAL_TABLET | Freq: Every day | ORAL | 5 refills | Status: DC | PRN

## 2021-04-05 NOTE — Progress Notes (Signed)
   Subjective:    Patient ID: Hector Smith, male    DOB: 07/11/1976, 45 y.o.   MRN: [id number]  HPI Here for a well exam. He feels well except he complains of trouble getting and maintaining erections. His libido is normal. He knows he is overweight, and he is trying to lose some weight.    Review of Systems  Constitutional: Negative.   HENT: Negative.    Eyes: Negative.   Respiratory: Negative.    Cardiovascular: Negative.   Gastrointestinal: Negative.   Genitourinary: Negative.   Musculoskeletal: Negative.   Skin: Negative.   Neurological: Negative.   Psychiatric/Behavioral: Negative.        Objective:   Physical Exam Constitutional:      General: He is not in acute distress.    Appearance: He is well-developed. He is obese. He is not diaphoretic.  HENT:     Head: Normocephalic and atraumatic.     Right Ear: External ear normal.     Left Ear: External ear normal.     Nose: Nose normal.     Mouth/Throat:     Pharynx: No oropharyngeal exudate.  Eyes:     General: No scleral icterus.       Right eye: No discharge.        Left eye: No discharge.     Conjunctiva/sclera: Conjunctivae normal.     Pupils: Pupils are equal, round, and reactive to light.  Neck:     Thyroid: No thyromegaly.     Vascular: No JVD.     Trachea: No tracheal deviation.  Cardiovascular:     Rate and Rhythm: Normal rate and regular rhythm.     Heart sounds: Normal heart sounds. No murmur heard.   No friction rub. No gallop.  Pulmonary:     Effort: Pulmonary effort is normal. No respiratory distress.     Breath sounds: Normal breath sounds. No wheezing or rales.  Chest:     Chest wall: No tenderness.  Abdominal:     General: Bowel sounds are normal. There is no distension.     Palpations: Abdomen is soft. There is no mass.     Tenderness: There is no abdominal tenderness. There is no guarding or rebound.  Genitourinary:    Penis: Normal. No tenderness.      Testes: Normal.  Musculoskeletal:         General: No tenderness. Normal range of motion.     Cervical back: Neck supple.  Lymphadenopathy:     Cervical: No cervical adenopathy.  Skin:    General: Skin is warm and dry.     Coloration: Skin is not pale.     Findings: No erythema or rash.  Neurological:     Mental Status: He is alert and oriented to person, place, and time.     Cranial Nerves: No cranial nerve deficit.     Motor: No abnormal muscle tone.     Coordination: Coordination normal.     Deep Tendon Reflexes: Reflexes are normal and symmetric. Reflexes normal.  Psychiatric:        Behavior: Behavior normal.        Thought Content: Thought content normal.        Judgment: Judgment normal.          Assessment & Plan:  Well exam. We discussed diet and exercise. Get fasting labs soon, including a testosterone level. Try Viagra as needed.  Gershon Crane, MD

## 2021-04-12 ENCOUNTER — Other Ambulatory Visit: Payer: Self-pay

## 2021-04-12 ENCOUNTER — Other Ambulatory Visit (INDEPENDENT_AMBULATORY_CARE_PROVIDER_SITE_OTHER): Payer: BC Managed Care – PPO

## 2021-04-12 DIAGNOSIS — Z0000 Encounter for general adult medical examination without abnormal findings: Secondary | ICD-10-CM

## 2021-04-12 LAB — HEPATIC FUNCTION PANEL
ALT: 57 U/L — ABNORMAL HIGH (ref 0–53)
AST: 31 U/L (ref 0–37)
Albumin: 4.3 g/dL (ref 3.5–5.2)
Alkaline Phosphatase: 38 U/L — ABNORMAL LOW (ref 39–117)
Bilirubin, Direct: 0.1 mg/dL (ref 0.0–0.3)
Total Bilirubin: 0.5 mg/dL (ref 0.2–1.2)
Total Protein: 6.7 g/dL (ref 6.0–8.3)

## 2021-04-12 LAB — LIPID PANEL
Cholesterol: 196 mg/dL (ref 0–200)
HDL: 45.8 mg/dL (ref >39.00)
LDL Cholesterol: 120 mg/dL — ABNORMAL HIGH (ref 0–99)
NonHDL: 150.37
Total CHOL/HDL Ratio: 4
Triglycerides: 152 mg/dL — ABNORMAL HIGH (ref 0.0–149.0)
VLDL: 30.4 mg/dL (ref 0.0–40.0)

## 2021-04-12 LAB — CBC WITH DIFFERENTIAL/PLATELET
Basophils Absolute: 0.1 10*3/uL (ref 0.0–0.1)
Basophils Relative: 0.9 % (ref 0.0–3.0)
Eosinophils Absolute: 0.2 10*3/uL (ref 0.0–0.7)
Eosinophils Relative: 3.1 % (ref 0.0–5.0)
HCT: 46 % (ref 39.0–52.0)
Hemoglobin: 16.2 g/dL (ref 13.0–17.0)
Lymphocytes Relative: 39.1 % (ref 12.0–46.0)
Lymphs Abs: 2.3 10*3/uL (ref 0.7–4.0)
MCHC: 35.3 g/dL (ref 30.0–36.0)
MCV: 88.3 fl (ref 78.0–100.0)
Monocytes Absolute: 0.3 10*3/uL (ref 0.1–1.0)
Monocytes Relative: 5.9 % (ref 3.0–12.0)
Neutro Abs: 2.9 10*3/uL (ref 1.4–7.7)
Neutrophils Relative %: 51 % (ref 43.0–77.0)
Platelets: 164 10*3/uL (ref 150.0–400.0)
RBC: 5.21 Mil/uL (ref 4.22–5.81)
RDW: 13.1 % (ref 11.5–15.5)
WBC: 5.8 10*3/uL (ref 4.0–10.5)

## 2021-04-12 LAB — BASIC METABOLIC PANEL
BUN: 16 mg/dL (ref 6–23)
CO2: 29 mEq/L (ref 19–32)
Calcium: 9.4 mg/dL (ref 8.4–10.5)
Chloride: 103 mEq/L (ref 96–112)
Creatinine, Ser: 1.03 mg/dL (ref 0.40–1.50)
GFR: 87.33 mL/min (ref >60.00)
Glucose, Bld: 107 mg/dL — ABNORMAL HIGH (ref 70–99)
Potassium: 4.4 mEq/L (ref 3.5–5.1)
Sodium: 139 mEq/L (ref 135–145)

## 2021-04-12 LAB — T4, FREE: Free T4: 0.61 ng/dL (ref 0.60–1.60)

## 2021-04-12 LAB — HEMOGLOBIN A1C: Hgb A1c MFr Bld: 5.6 % (ref 4.6–6.5)

## 2021-04-12 LAB — TESTOSTERONE: Testosterone: 176.19 ng/dL — ABNORMAL LOW (ref 300.00–890.00)

## 2021-04-12 LAB — TSH: TSH: 2.68 u[IU]/mL (ref 0.35–4.50)

## 2021-04-12 LAB — T3, FREE: T3, Free: 4.2 pg/mL (ref 2.3–4.2)

## 2021-06-29 ENCOUNTER — Telehealth: Payer: Self-pay | Admitting: Family Medicine

## 2021-06-29 DIAGNOSIS — E291 Testicular hypofunction: Secondary | ICD-10-CM

## 2021-06-29 NOTE — Telephone Encounter (Signed)
We spoke of him taking testosterone. Have him ask his insurance company what types of testosterone replacement they will cover

## 2021-06-29 NOTE — Telephone Encounter (Signed)
Last office visit 03/2021.

## 2021-06-29 NOTE — Telephone Encounter (Signed)
PT is calling in regards to his last apt with Dr.Fry. He would like to find out what the supplement was that they had talked about that is suppose to help him with the low T.

## 2021-06-30 ENCOUNTER — Telehealth: Payer: Self-pay | Admitting: Family Medicine

## 2021-06-30 DIAGNOSIS — E291 Testicular hypofunction: Secondary | ICD-10-CM | POA: Insufficient documentation

## 2021-06-30 MED ORDER — TESTOSTERONE 20.25 MG/1.25GM (1.62%) TD GEL: 4.0000 | Freq: Every day | TRANSDERMAL | 5 refills | Status: DC

## 2021-06-30 NOTE — Telephone Encounter (Signed)
Error

## 2021-06-30 NOTE — Telephone Encounter (Signed)
Patient called stating he spoke with insurance and insurance stated Dr.Fry would have to fill out a form for insurance and send it in to see if insurance will cover the cost of drug for the low T      Good callback number for questions is [phone]

## 2021-06-30 NOTE — Telephone Encounter (Signed)
Spoke with patient.   He will call his insurance company to inquire about Testosterone coverage then will send my chart message with information.

## 2021-06-30 NOTE — Telephone Encounter (Signed)
Called [employer], Testosterone is covered only for replacement therapy, not ED.    Covered medication is Testosterone 2%, Androgel 1.62%, Testosterone topical solution,  and Androderm

## 2021-06-30 NOTE — Addendum Note (Signed)
Addended by: Gershon Crane A on: 06/30/2021 05:08 PM   Modules accepted: Orders

## 2021-06-30 NOTE — Telephone Encounter (Signed)
I sent in for the Androgel pump. Let the insurance company know that this is for hypogonadism (not ED)

## 2021-07-01 NOTE — Telephone Encounter (Signed)
Spoke with pt pharmacy, advised of the diagnosis of pt Rx for Androgel, pharmacist state that insurance approved Rx but pt will need to pay $5 out of pocket cost, pt verbalized understanding

## 2022-01-04 ENCOUNTER — Other Ambulatory Visit: Payer: Self-pay | Admitting: Family Medicine

## 2022-01-04 NOTE — Telephone Encounter (Signed)
Last refill- 06/30/2021 ?Last OV-04/05/2021 ? ?No future OV scheduled. ?

## 2022-05-12 ENCOUNTER — Telehealth: Payer: Self-pay | Admitting: Family Medicine

## 2022-05-12 NOTE — Telephone Encounter (Signed)
Pt has a cpe sch for 06-06-2022 and no longer want testosterone gel and  would new rx testosterone he will like to give himself the injections  [id number] - [place], [place] - [address] AT [facility] [address] Phone:  [phone]  Fax:  [phone]

## 2022-05-13 NOTE — Telephone Encounter (Signed)
He will need to wait until the exam day for the new RX because insurance will want [place] to check another blood level

## 2022-05-13 NOTE — Telephone Encounter (Signed)
Pharmacy updated.

## 2022-05-13 NOTE — Telephone Encounter (Signed)
Pt was advised of Dr Clent Ridges recommendation, verbalized understanding

## 2022-06-06 ENCOUNTER — Ambulatory Visit (INDEPENDENT_AMBULATORY_CARE_PROVIDER_SITE_OTHER): Payer: BC Managed Care – PPO | Admitting: Family Medicine

## 2022-06-06 ENCOUNTER — Encounter: Payer: Self-pay | Admitting: Family Medicine

## 2022-06-06 VITALS — BP 138/96 | HR 88 | Temp 98.5°F | Ht 70.5 in | Wt 295.0 lb

## 2022-06-06 DIAGNOSIS — Z0000 Encounter for general adult medical examination without abnormal findings: Secondary | ICD-10-CM

## 2022-06-06 DIAGNOSIS — E291 Testicular hypofunction: Secondary | ICD-10-CM

## 2022-06-06 LAB — CBC WITH DIFFERENTIAL/PLATELET
Basophils Absolute: 0.1 10*3/uL (ref 0.0–0.1)
Basophils Relative: 2.3 % (ref 0.0–3.0)
Eosinophils Absolute: 0.1 10*3/uL (ref 0.0–0.7)
Eosinophils Relative: 2 % (ref 0.0–5.0)
HCT: 51.7 % (ref 39.0–52.0)
Hemoglobin: 17.9 g/dL — ABNORMAL HIGH (ref 13.0–17.0)
Lymphocytes Relative: 30 % (ref 12.0–46.0)
Lymphs Abs: 2 10*3/uL (ref 0.7–4.0)
MCHC: 34.6 g/dL (ref 30.0–36.0)
MCV: 90.4 fl (ref 78.0–100.0)
Monocytes Absolute: 0.5 10*3/uL (ref 0.1–1.0)
Monocytes Relative: 6.9 % (ref 3.0–12.0)
Neutro Abs: 3.9 10*3/uL (ref 1.4–7.7)
Neutrophils Relative %: 58.8 % (ref 43.0–77.0)
Platelets: 182 10*3/uL (ref 150.0–400.0)
RBC: 5.72 Mil/uL (ref 4.22–5.81)
RDW: 13.5 % (ref 11.5–15.5)
WBC: 6.6 10*3/uL (ref 4.0–10.5)

## 2022-06-06 LAB — BASIC METABOLIC PANEL
BUN: 13 mg/dL (ref 6–23)
CO2: 29 mEq/L (ref 19–32)
Calcium: 9.6 mg/dL (ref 8.4–10.5)
Chloride: 101 mEq/L (ref 96–112)
Creatinine, Ser: 1.02 mg/dL (ref 0.40–1.50)
GFR: 87.65 mL/min (ref >60.00)
Glucose, Bld: 102 mg/dL — ABNORMAL HIGH (ref 70–99)
Potassium: 4.6 mEq/L (ref 3.5–5.1)
Sodium: 139 mEq/L (ref 135–145)

## 2022-06-06 LAB — HEPATIC FUNCTION PANEL
ALT: 54 U/L — ABNORMAL HIGH (ref 0–53)
AST: 29 U/L (ref 0–37)
Albumin: 4.7 g/dL (ref 3.5–5.2)
Alkaline Phosphatase: 38 U/L — ABNORMAL LOW (ref 39–117)
Bilirubin, Direct: 0.1 mg/dL (ref 0.0–0.3)
Total Bilirubin: 0.5 mg/dL (ref 0.2–1.2)
Total Protein: 7.4 g/dL (ref 6.0–8.3)

## 2022-06-06 LAB — LIPID PANEL
Cholesterol: 197 mg/dL (ref 0–200)
HDL: 47.8 mg/dL (ref >39.00)
LDL Cholesterol: 128 mg/dL — ABNORMAL HIGH (ref 0–99)
NonHDL: 149.29
Total CHOL/HDL Ratio: 4
Triglycerides: 107 mg/dL (ref 0.0–149.0)
VLDL: 21.4 mg/dL (ref 0.0–40.0)

## 2022-06-06 LAB — TSH: TSH: 1.56 u[IU]/mL (ref 0.35–5.50)

## 2022-06-06 LAB — HEMOGLOBIN A1C: Hgb A1c MFr Bld: 5.6 % (ref 4.6–6.5)

## 2022-06-06 LAB — TESTOSTERONE: Testosterone: 161.71 ng/dL — ABNORMAL LOW (ref 300.00–890.00)

## 2022-06-06 LAB — PSA: PSA: 2.62 ng/mL (ref 0.10–4.00)

## 2022-06-06 MED ORDER — SILDENAFIL CITRATE 100 MG PO TABS: 100.0000 mg | ORAL_TABLET | Freq: Every day | ORAL | 5 refills | Status: DC | PRN

## 2022-06-06 MED ORDER — TESTOSTERONE CYPIONATE 200 MG/ML IM SOLN: 200.0000 mg | INTRAMUSCULAR | 1 refills | Status: DC

## 2022-06-06 MED ORDER — "LUER LOCK SAFETY SYRINGES 21G X 1-1/2"" 3 ML MISC": 1.0000 | 2 refills | Status: DC

## 2022-06-06 NOTE — Progress Notes (Signed)
   Subjective:    Patient ID: Hector Smith, male    DOB: July 10, 1976, 46 y.o.   MRN: [id number]  HPI Here for a well exam. He feels fine, but he says that his insurance company prefers him to use testosterone shots rather than the gel.    Review of Systems  Constitutional: Negative.   HENT: Negative.    Eyes: Negative.   Respiratory: Negative.    Cardiovascular: Negative.   Gastrointestinal: Negative.   Genitourinary: Negative.   Musculoskeletal: Negative.   Skin: Negative.   Neurological: Negative.   Psychiatric/Behavioral: Negative.         Objective:   Physical Exam Constitutional:      General: He is not in acute distress.    Appearance: He is well-developed. He is obese. He is not diaphoretic.  HENT:     Head: Normocephalic and atraumatic.     Right Ear: External ear normal.     Left Ear: External ear normal.     Nose: Nose normal.     Mouth/Throat:     Pharynx: No oropharyngeal exudate.  Eyes:     General: No scleral icterus.       Right eye: No discharge.        Left eye: No discharge.     Conjunctiva/sclera: Conjunctivae normal.     Pupils: Pupils are equal, round, and reactive to light.  Neck:     Thyroid: No thyromegaly.     Vascular: No JVD.     Trachea: No tracheal deviation.  Cardiovascular:     Rate and Rhythm: Normal rate and regular rhythm.     Heart sounds: Normal heart sounds. No murmur heard.    No friction rub. No gallop.  Pulmonary:     Effort: Pulmonary effort is normal. No respiratory distress.     Breath sounds: Normal breath sounds. No wheezing or rales.  Chest:     Chest wall: No tenderness.  Abdominal:     General: Bowel sounds are normal. There is no distension.     Palpations: Abdomen is soft. There is no mass.     Tenderness: There is no abdominal tenderness. There is no guarding or rebound.  Genitourinary:    Penis: Normal. No tenderness.      Testes: Normal.  Musculoskeletal:        General: No tenderness. Normal range of  motion.     Cervical back: Neck supple.  Lymphadenopathy:     Cervical: No cervical adenopathy.  Skin:    General: Skin is warm and dry.     Coloration: Skin is not pale.     Findings: No erythema or rash.  Neurological:     Mental Status: He is alert and oriented to person, place, and time.     Cranial Nerves: No cranial nerve deficit.     Motor: No abnormal muscle tone.     Coordination: Coordination normal.     Deep Tendon Reflexes: Reflexes are normal and symmetric. Reflexes normal.  Psychiatric:        Behavior: Behavior normal.        Thought Content: Thought content normal.        Judgment: Judgment normal.           Assessment & Plan:  Well exam. We discussed diet and exercise. Get fasting labs. We will switch from the gel to testosterone cypionate 200 mg per week.  Gershon Crane, MD

## 2022-06-08 NOTE — Addendum Note (Signed)
Addended by: Johnella Moloney on: 06/08/2022 09:21 AM   Modules accepted: Orders

## 2022-07-07 IMAGING — DX DG CHEST 2V
2 series · 2 of 2 positions shown · non-contrast
Comparison: None.

CLINICAL DATA: Cough, congestion, and weakness since [REDACTED].

EXAM:
CHEST - 2 VIEW

[chest pa]
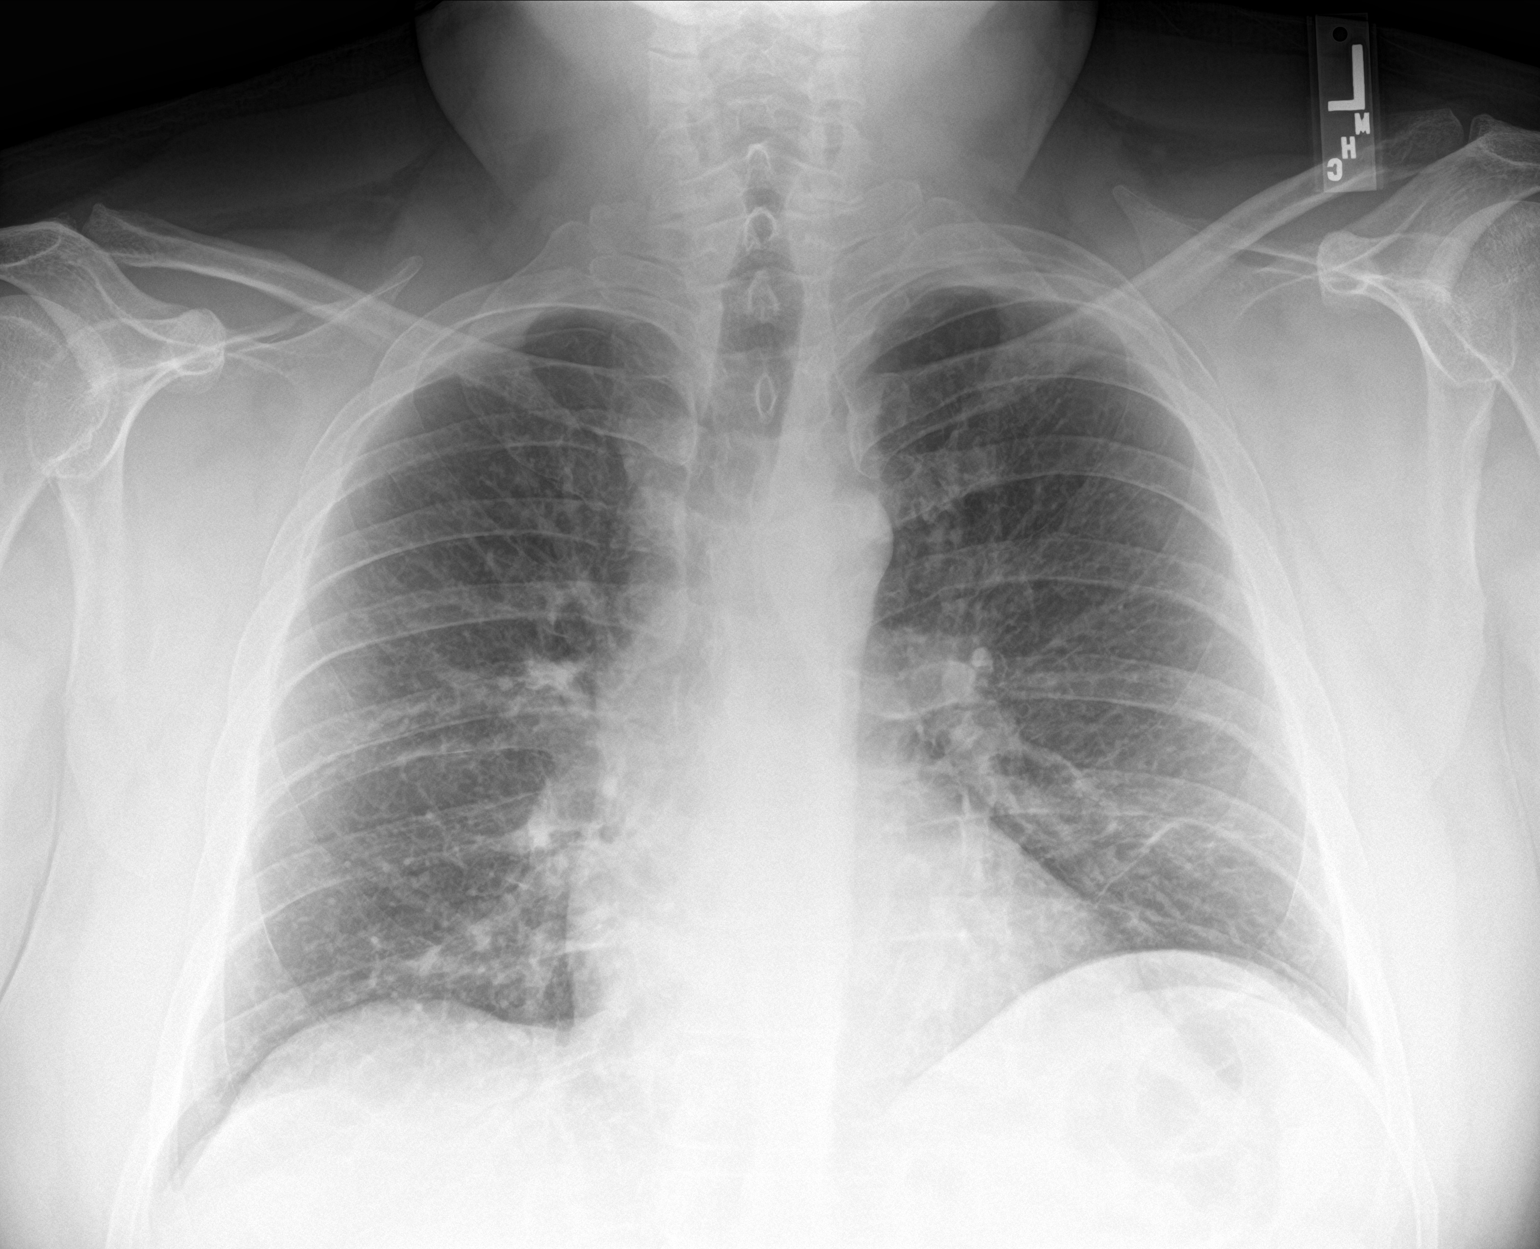

[chest lat]
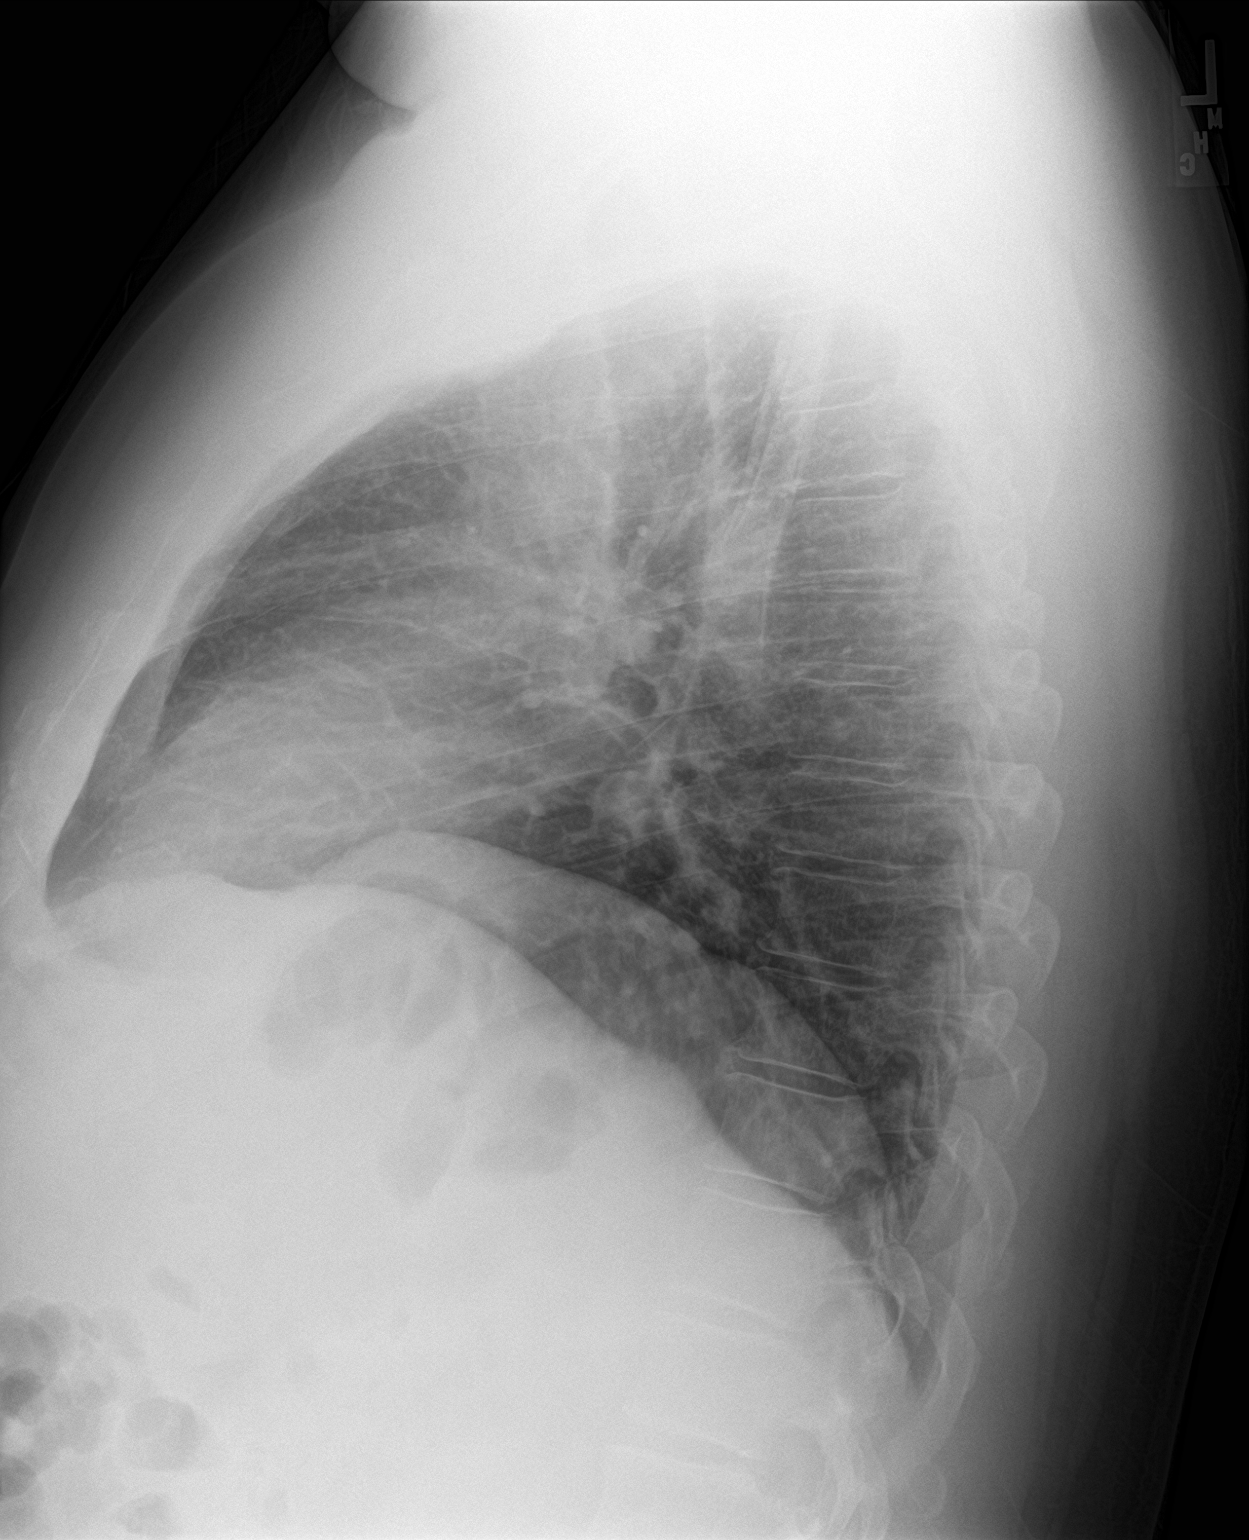

[2 of 2 positions shown; findings below may reference images not displayed]

FINDINGS: The heart size and mediastinal contours are within normal limits.
Normal pulmonary vascularity. Mild interstitial opacity in the left
lower lobe and inferior right upper lobe. No focal consolidation,
pleural effusion, or pneumothorax. No acute osseous abnormality.
IMPRESSION: 1. Mild interstitial opacity in the left lower lobe and inferior
right upper lobe, suspicious for atypical infection.

## 2022-08-02 ENCOUNTER — Other Ambulatory Visit: Payer: Self-pay | Admitting: Family Medicine

## 2022-10-31 ENCOUNTER — Other Ambulatory Visit: Payer: BC Managed Care – PPO

## 2022-10-31 ENCOUNTER — Telehealth: Payer: Self-pay | Admitting: Family Medicine

## 2022-10-31 DIAGNOSIS — E291 Testicular hypofunction: Secondary | ICD-10-CM

## 2022-10-31 LAB — TESTOSTERONE: Testosterone: 823.42 ng/dL (ref 300.00–890.00)

## 2022-10-31 MED ORDER — TESTOSTERONE CYPIONATE 200 MG/ML IM SOLN: 200.0000 mg | INTRAMUSCULAR | 1 refills | Status: DC

## 2022-10-31 MED ORDER — "SAFETY SYRINGE/NEEDLE 22G X 1"" 3 ML MISC": 0 refills | Status: AC

## 2022-10-31 NOTE — Telephone Encounter (Signed)
We can change to 22 gauge needles (this would be slightly skinnier than what he has) but the length is different. If he is giving this shots in the butticks, 1 and 1/2 inches is the standard length to get into the muscle properly

## 2022-10-31 NOTE — Telephone Encounter (Signed)
Patient states he was supposed to have gotten a 12 wk supply of testosterone called in to the pharmacy, however only 10 weeks were called in so he is out.  He needs a 12 week supply called in.  [facility] [address]

## 2022-10-31 NOTE — Telephone Encounter (Signed)
Spoke with patient below message given.   He requested a prescription for syringe-needles 22G.    Current prescription on file is 21G , patient said that needle is to long.

## 2022-10-31 NOTE — Telephone Encounter (Signed)
Pt notified of update needles sent to pharmacy

## 2022-10-31 NOTE — Telephone Encounter (Signed)
His level is excellent. I sent in refills. Last time and today I ordered 12 ml at a time. Since the dose is 1 ml a week, this represents a 12 week supply. He should make sure they give him 12 vials

## 2022-10-31 NOTE — Telephone Encounter (Signed)
FYI Pt notified that we will wait for lab results to return before refilling. Pt stated that he took his last shot Friday so he will be ok until then.

## 2022-11-01 NOTE — Telephone Encounter (Signed)
Noted  

## 2023-01-16 ENCOUNTER — Encounter: Payer: Self-pay | Admitting: Family Medicine

## 2023-01-16 ENCOUNTER — Ambulatory Visit: Payer: BC Managed Care – PPO | Admitting: Family Medicine

## 2023-01-16 NOTE — Progress Notes (Signed)
   Subjective:    Patient ID: Hector Smith, male    DOB: [birth year], 47 y.o.   MRN: [id number]  HPI Here asking for a referral to Nutrition to help him lose weight. 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 He and his partner never have time to sit down for a meal, so they get fast food whenever they stop at a truck stop. He is also very sedentary on this job, where he is either driving or sleeping all the time. His BP has been borderline high. His A1c here last summer was 5.6%.    Review of Systems  Constitutional: Negative.   Respiratory: Negative.    Cardiovascular: Negative.   Gastrointestinal: Negative.   Genitourinary: Negative.   Neurological: Negative.        Objective:   Physical Exam Constitutional:      Appearance: He is obese.  Cardiovascular:     Rate and Rhythm: Normal rate and regular rhythm.     Pulses: Normal pulses.     Heart sounds: Normal heart sounds.  Pulmonary:     Effort: Pulmonary effort is normal.     Breath sounds: Normal breath sounds.  Neurological:     Mental Status: He is alert.           Assessment & Plan:  Morbid obesity. We will refer him to Nutrition for advice.  Alysia Penna, MD

## 2023-03-03 ENCOUNTER — Ambulatory Visit: Payer: BC Managed Care – PPO | Admitting: Dietician

## 2023-04-06 ENCOUNTER — Telehealth: Payer: Self-pay | Admitting: Family Medicine

## 2023-04-06 DIAGNOSIS — E291 Testicular hypofunction: Secondary | ICD-10-CM

## 2023-04-06 NOTE — Telephone Encounter (Signed)
Pt is calling and would like to have his testosterone recheck

## 2023-04-07 ENCOUNTER — Encounter: Payer: BC Managed Care – PPO | Attending: Family Medicine | Admitting: Dietician

## 2023-04-07 ENCOUNTER — Encounter: Payer: Self-pay | Admitting: Dietician

## 2023-04-07 NOTE — Patient Instructions (Signed)
Change your relationship with food! Bake rather than fry Decrease portion size of meat to the size of your palm. Find ways to increase your vegetables and fruit.  Get moving - treadmill, punching bag, walking  Mindfulness:  Consistently scheduled meal - avoid skipping  Choices  Eat slowly  Away from distraction (sitting in kitchen or dining room)  Stop eating when satisfied  Before a snack ask, "Am I hungry or eating for another reason?"   "What can I do instead if I am not hungry?"  Try to find something every day that brings you joy!
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
## 2023-04-11 NOTE — Telephone Encounter (Signed)
Yes please check it

## 2023-04-11 NOTE — Telephone Encounter (Signed)
Okay to order?

## 2023-04-12 NOTE — Telephone Encounter (Signed)
Lab appointment and order placed 

## 2023-04-14 ENCOUNTER — Telehealth: Payer: Self-pay | Admitting: Family Medicine

## 2023-04-14 ENCOUNTER — Other Ambulatory Visit (INDEPENDENT_AMBULATORY_CARE_PROVIDER_SITE_OTHER): Payer: BC Managed Care – PPO

## 2023-04-14 DIAGNOSIS — E291 Testicular hypofunction: Secondary | ICD-10-CM | POA: Diagnosis not present

## 2023-04-14 LAB — TESTOSTERONE: Testosterone: 631.61 ng/dL (ref 300.00–890.00)

## 2023-04-14 NOTE — Telephone Encounter (Signed)
Prescription Request  04/14/2023  LOV: 01/16/2023  What is the name of the medication or equipment? Testosterone cypionate  Have you contacted your pharmacy to request a refill? No   Which pharmacy would you like this sent to?  [id number] - [place], [place] - [address] AT [facility] [address] [place] [address] Phone: [phone] Fax: [phone]    Patient notified that their request is being sent to the clinical staff for review and that they should receive a response within 2 business days.   Please advise at Mobile [phone] (mobile)

## 2023-04-14 NOTE — Telephone Encounter (Signed)
Pt LOV was on 01/16/23 Last refill was done on 10/31/22 Please advise

## 2023-04-17 MED ORDER — TESTOSTERONE CYPIONATE 200 MG/ML IM SOLN: 200.0000 mg | INTRAMUSCULAR | 1 refills | Status: DC

## 2023-04-17 NOTE — Telephone Encounter (Signed)
Done

## 2023-06-01 ENCOUNTER — Telehealth: Payer: Self-pay | Admitting: Family Medicine

## 2023-06-01 NOTE — Telephone Encounter (Signed)
Prescription Request  06/01/2023  LOV: 01/16/2023  What is the name of the medication or equipment? sildenafil (VIAGRA) 100 MG tablet   Have you contacted your pharmacy to request a refill? No   Which pharmacy would you like this sent to?  [id number] - [place], [place] - [address] AT [facility] [address] [place] [address] Phone: [phone] Fax: [phone]    Patient notified that their request is being sent to the clinical staff for review and that they should receive a response within 2 business days.   Please advise at Mobile [phone] (mobile)

## 2023-06-07 MED ORDER — SILDENAFIL CITRATE 100 MG PO TABS: 100.0000 mg | ORAL_TABLET | Freq: Every day | ORAL | 5 refills | Status: DC | PRN

## 2023-09-28 ENCOUNTER — Telehealth: Payer: Self-pay | Admitting: Family Medicine

## 2023-09-28 DIAGNOSIS — E291 Testicular hypofunction: Secondary | ICD-10-CM

## 2023-09-28 NOTE — Telephone Encounter (Signed)
Pt is calling and said its time for his testosterone to be check

## 2023-09-29 NOTE — Telephone Encounter (Signed)
Last Testosterone Lab was done on 6/24 and it was Normal. ok to schedule pt for a recheck

## 2023-09-29 NOTE — Telephone Encounter (Signed)
I put in the order

## 2023-10-02 NOTE — Telephone Encounter (Signed)
Pt scheduled for lab appointment 10/06/23 at 9 am

## 2023-10-06 ENCOUNTER — Other Ambulatory Visit (INDEPENDENT_AMBULATORY_CARE_PROVIDER_SITE_OTHER): Payer: BC Managed Care – PPO

## 2023-10-06 DIAGNOSIS — E291 Testicular hypofunction: Secondary | ICD-10-CM

## 2023-10-06 LAB — TESTOSTERONE: Testosterone: 1130.18 ng/dL — ABNORMAL HIGH (ref 300.00–890.00)

## 2023-10-13 ENCOUNTER — Other Ambulatory Visit: Payer: Self-pay | Admitting: Family Medicine

## 2023-10-13 NOTE — Telephone Encounter (Signed)
Copied from CRM [id number]. Topic: Clinical - Medication Refill >> Oct 13, 2023  3:21 PM Dimitri Ped wrote: Most Recent Primary Care Visit:  Provider: [facility]  Department: [facility]  Visit Type: LAB  Date: 10/06/2023  Medication: testosterone cypionate (DEPOTESTOSTERONE CYPIONATE) 200 MG/ML injection  Has the patient contacted their pharmacy? No only get one refill on it  (Agent: If no, request that the patient contact the pharmacy for the refill. If patient does not wish to contact the pharmacy document the reason why and proceed with request.) (Agent: If yes, when and what did the pharmacy advise?)  Is this the correct pharmacy for this prescription?  If no, delete pharmacy and type the correct one.  This is the patient's preferred pharmacy:  [id number] - [place], [place] - [address] AT [facility] [address] [place] [address] Phone: [phone] Fax: [phone]   Has the prescription been filled recently?   Is the patient out of the medication?   Has the patient been seen for an appointment in the last year OR does the patient have an upcoming appointment?   Can we respond through MyChart?   Agent: Please be advised that Rx refills may take up to 3 business days. We ask that you follow-up with your pharmacy.

## 2023-10-16 MED ORDER — TESTOSTERONE CYPIONATE 200 MG/ML IM SOLN: 200.0000 mg | INTRAMUSCULAR | 1 refills | Status: DC

## 2023-11-06 ENCOUNTER — Ambulatory Visit: Payer: BC Managed Care – PPO | Admitting: Family Medicine

## 2023-11-06 ENCOUNTER — Encounter: Payer: Self-pay | Admitting: Family Medicine

## 2023-11-06 ENCOUNTER — Telehealth: Payer: Self-pay

## 2023-11-06 VITALS — BP 158/100 | HR 87 | Temp 98.1°F | Wt 312.0 lb

## 2023-11-06 DIAGNOSIS — Z0000 Encounter for general adult medical examination without abnormal findings: Secondary | ICD-10-CM | POA: Diagnosis not present

## 2023-11-06 DIAGNOSIS — R739 Hyperglycemia, unspecified: Secondary | ICD-10-CM

## 2023-11-06 DIAGNOSIS — I10 Essential (primary) hypertension: Secondary | ICD-10-CM | POA: Diagnosis not present

## 2023-11-06 LAB — CBC WITH DIFFERENTIAL/PLATELET
Basophils Absolute: 0.1 10*3/uL (ref 0.0–0.1)
Basophils Relative: 0.9 % (ref 0.0–3.0)
Eosinophils Absolute: 0.1 10*3/uL (ref 0.0–0.7)
Eosinophils Relative: 2.2 % (ref 0.0–5.0)
HCT: 56 % — ABNORMAL HIGH (ref 39.0–52.0)
Hemoglobin: 19.3 g/dL (ref 13.0–17.0)
Lymphocytes Relative: 29.7 % (ref 12.0–46.0)
Lymphs Abs: 1.9 10*3/uL (ref 0.7–4.0)
MCHC: 34.4 g/dL (ref 30.0–36.0)
MCV: 90.6 fL (ref 78.0–100.0)
Monocytes Absolute: 0.5 10*3/uL (ref 0.1–1.0)
Monocytes Relative: 7.2 % (ref 3.0–12.0)
Neutro Abs: 3.8 10*3/uL (ref 1.4–7.7)
Neutrophils Relative %: 60 % (ref 43.0–77.0)
Platelets: 167 10*3/uL (ref 150.0–400.0)
RBC: 6.19 Mil/uL — ABNORMAL HIGH (ref 4.22–5.81)
RDW: 14.2 % (ref 11.5–15.5)
WBC: 6.3 10*3/uL (ref 4.0–10.5)

## 2023-11-06 LAB — HEPATIC FUNCTION PANEL
ALT: 62 U/L — ABNORMAL HIGH (ref 0–53)
AST: 32 U/L (ref 0–37)
Albumin: 4.6 g/dL (ref 3.5–5.2)
Alkaline Phosphatase: 37 U/L — ABNORMAL LOW (ref 39–117)
Bilirubin, Direct: 0.1 mg/dL (ref 0.0–0.3)
Total Bilirubin: 0.6 mg/dL (ref 0.2–1.2)
Total Protein: 7 g/dL (ref 6.0–8.3)

## 2023-11-06 LAB — LIPID PANEL
Cholesterol: 178 mg/dL (ref 0–200)
HDL: 32.3 mg/dL — ABNORMAL LOW (ref >39.00)
LDL Cholesterol: 128 mg/dL — ABNORMAL HIGH (ref 0–99)
NonHDL: 145.79
Total CHOL/HDL Ratio: 6
Triglycerides: 91 mg/dL (ref 0.0–149.0)
VLDL: 18.2 mg/dL (ref 0.0–40.0)

## 2023-11-06 LAB — TSH: TSH: 1.56 u[IU]/mL (ref 0.35–5.50)

## 2023-11-06 LAB — BASIC METABOLIC PANEL
BUN: 11 mg/dL (ref 6–23)
CO2: 29 meq/L (ref 19–32)
Calcium: 9.1 mg/dL (ref 8.4–10.5)
Chloride: 101 meq/L (ref 96–112)
Creatinine, Ser: 1.02 mg/dL (ref 0.40–1.50)
GFR: 86.78 mL/min (ref >60.00)
Glucose, Bld: 106 mg/dL — ABNORMAL HIGH (ref 70–99)
Potassium: 4 meq/L (ref 3.5–5.1)
Sodium: 138 meq/L (ref 135–145)

## 2023-11-06 LAB — HEMOGLOBIN A1C: Hgb A1c MFr Bld: 6.9 % — ABNORMAL HIGH (ref 4.6–6.5)

## 2023-11-06 MED ORDER — LOSARTAN POTASSIUM-HCTZ 100-25 MG PO TABS: 1.0000 | ORAL_TABLET | Freq: Every day | ORAL | 2 refills | Status: DC

## 2023-11-06 NOTE — Progress Notes (Signed)
Subjective:    Patient ID: Hector Smith, male    DOB: 1976-09-03, 48 y.o.   MRN: [id number]  HPI Here for a well exam and to address high BP. 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 They gave him a temporary 90 day extension and told him to see [place]. He feels well.    Review of Systems  Constitutional: Negative.   HENT: Negative.    Eyes: Negative.   Respiratory: Negative.    Cardiovascular: Negative.   Gastrointestinal: Negative.   Genitourinary: Negative.   Musculoskeletal: Negative.   Skin: Negative.   Neurological: Negative.   Psychiatric/Behavioral: Negative.         Objective:   Physical Exam Constitutional:      General: He is not in acute distress.    Appearance: He is well-developed. He is obese. He is not diaphoretic.  HENT:     Head: Normocephalic and atraumatic.     Right Ear: External ear normal.     Left Ear: External ear normal.     Nose: Nose normal.     Mouth/Throat:     Pharynx: No oropharyngeal exudate.  Eyes:     General: No scleral icterus.       Right eye: No discharge.        Left eye: No discharge.     Conjunctiva/sclera: Conjunctivae normal.     Pupils: Pupils are equal, round, and reactive to light.  Neck:     Thyroid: No thyromegaly.     Vascular: No JVD.     Trachea: No tracheal deviation.  Cardiovascular:     Rate and Rhythm: Normal rate and regular rhythm.     Pulses: Normal pulses.     Heart sounds: Normal heart sounds. No murmur heard.    No friction rub. No gallop.  Pulmonary:     Effort: Pulmonary effort is normal. No respiratory distress.     Breath sounds: Normal breath sounds. No wheezing or rales.  Chest:     Chest wall: No tenderness.  Abdominal:     General: Bowel sounds are normal. There is no distension.     Palpations: Abdomen is soft. There is no mass.     Tenderness: There is no abdominal tenderness. There is no guarding or rebound.  Genitourinary:    Penis: Normal. No tenderness.       Testes: Normal.     Prostate: Normal.     Rectum: Normal. Guaiac result negative.  Musculoskeletal:        General: No tenderness. Normal range of motion.     Cervical back: Neck supple.  Lymphadenopathy:     Cervical: No cervical adenopathy.  Skin:    General: Skin is warm and dry.     Coloration: Skin is not pale.     Findings: No erythema or rash.  Neurological:     General: No focal deficit present.     Mental Status: He is alert and oriented to person, place, and time.     Cranial Nerves: No cranial nerve deficit.     Motor: No abnormal muscle tone.     Coordination: Coordination normal.     Deep Tendon Reflexes: Reflexes are normal and symmetric. Reflexes normal.  Psychiatric:        Mood and Affect: Mood normal.        Behavior: Behavior normal.        Thought Content: Thought content normal.  Judgment: Judgment normal.           Assessment & Plan:  Well exam. We discussed diet and exercise. Get fasting labs. He has HTN, and we will start him on Losartan HCT 100-25 to take daily. He will follow up in 3 weeks. Gershon Crane, MD

## 2023-11-06 NOTE — Telephone Encounter (Signed)
Message sent to Dr Fry for advise 

## 2023-11-07 NOTE — Telephone Encounter (Signed)
Notified pt of lab results, verbalized udnerstanding

## 2023-11-07 NOTE — Telephone Encounter (Signed)
See my Result Note for those labs

## 2023-11-24 ENCOUNTER — Ambulatory Visit: Payer: BC Managed Care – PPO | Admitting: Family Medicine

## 2023-11-24 ENCOUNTER — Encounter: Payer: Self-pay | Admitting: Family Medicine

## 2023-11-24 VITALS — BP 150/90 | HR 89 | Temp 98.1°F | Ht 70.5 in | Wt 309.8 lb

## 2023-11-24 DIAGNOSIS — I10 Essential (primary) hypertension: Secondary | ICD-10-CM | POA: Diagnosis not present

## 2023-11-24 MED ORDER — AMLODIPINE BESYLATE 5 MG PO TABS: 5.0000 mg | ORAL_TABLET | Freq: Every day | ORAL | 2 refills | Status: DC

## 2023-11-24 NOTE — Progress Notes (Signed)
   Subjective:    Patient ID: Hector Smith, male    DOB: 05/27/1976, 48 y.o.   MRN: [id number]  HPI Here to follow up on HTN. At our last visit we started him on Losartan  HCT, and he says he feels better.    Review of Systems  Constitutional: Negative.   Respiratory: Negative.    Cardiovascular: Negative.        Objective:   Physical Exam Constitutional:      Appearance: Normal appearance.  Cardiovascular:     Rate and Rhythm: Normal rate and regular rhythm.     Pulses: Normal pulses.     Heart sounds: Normal heart sounds.  Pulmonary:     Effort: Pulmonary effort is normal.     Breath sounds: Normal breath sounds.  Neurological:     Mental Status: He is alert.           Assessment & Plan:  HTN. We will add Amlodipine  5 mg daily. Recheck in 3 weeks.  Garnette Olmsted, MD

## 2023-12-15 ENCOUNTER — Ambulatory Visit: Payer: BC Managed Care – PPO | Admitting: Family Medicine

## 2023-12-15 ENCOUNTER — Encounter: Payer: Self-pay | Admitting: Family Medicine

## 2023-12-15 VITALS — BP 130/70 | HR 93 | Temp 98.0°F | Wt 307.0 lb

## 2023-12-15 DIAGNOSIS — I10 Essential (primary) hypertension: Secondary | ICD-10-CM | POA: Diagnosis not present

## 2023-12-15 MED ORDER — AMLODIPINE BESYLATE 5 MG PO TABS: 5.0000 mg | ORAL_TABLET | Freq: Every day | ORAL | 3 refills | Status: AC

## 2023-12-15 MED ORDER — LOSARTAN POTASSIUM-HCTZ 100-25 MG PO TABS: 1.0000 | ORAL_TABLET | Freq: Every day | ORAL | 3 refills | Status: AC

## 2023-12-15 NOTE — Progress Notes (Signed)
   Subjective:    Patient ID: Hector Smith, male    DOB: [birth year], 48 y.o.   MRN: [id number]  HPI Here to follow up on HTN. He has felt fine since out last visit when we added Amlodipine to his Losartan HCT.    Review of Systems  Constitutional: Negative.   Respiratory: Negative.    Cardiovascular: Negative.        Objective:   Physical Exam Constitutional:      Appearance: Normal appearance.  Cardiovascular:     Rate and Rhythm: Normal rate and regular rhythm.     Pulses: Normal pulses.     Heart sounds: Normal heart sounds.  Pulmonary:     Effort: Pulmonary effort is normal.     Breath sounds: Normal breath sounds.  Neurological:     Mental Status: He is alert.           Assessment & Plan:  His HTN is now well controlled. We will stay on this regimen. We signed a document for him to take back to [place] [employer] to document this.  Gershon Crane, MD

## 2024-03-13 ENCOUNTER — Ambulatory Visit: Payer: BC Managed Care – PPO | Admitting: Family Medicine

## 2024-03-18 ENCOUNTER — Encounter: Payer: Self-pay | Admitting: Family Medicine

## 2024-03-18 ENCOUNTER — Ambulatory Visit: Admitting: Family Medicine

## 2024-03-18 ENCOUNTER — Telehealth: Payer: Self-pay

## 2024-03-18 VITALS — BP 138/80 | HR 86 | Temp 99.5°F | Wt 302.0 lb

## 2024-03-18 DIAGNOSIS — R7303 Prediabetes: Secondary | ICD-10-CM | POA: Insufficient documentation

## 2024-03-18 DIAGNOSIS — J069 Acute upper respiratory infection, unspecified: Secondary | ICD-10-CM | POA: Diagnosis not present

## 2024-03-18 DIAGNOSIS — I10 Essential (primary) hypertension: Secondary | ICD-10-CM

## 2024-03-18 DIAGNOSIS — E291 Testicular hypofunction: Secondary | ICD-10-CM | POA: Diagnosis not present

## 2024-03-18 LAB — CBC WITH DIFFERENTIAL/PLATELET
Basophils Absolute: 0.1 10*3/uL (ref 0.0–0.1)
Basophils Relative: 1.1 % (ref 0.0–3.0)
Eosinophils Absolute: 0.2 10*3/uL (ref 0.0–0.7)
Eosinophils Relative: 3.1 % (ref 0.0–5.0)
HCT: 53.2 % — ABNORMAL HIGH (ref 39.0–52.0)
Hemoglobin: 18.6 g/dL (ref 13.0–17.0)
Lymphocytes Relative: 21.1 % (ref 12.0–46.0)
Lymphs Abs: 1.5 10*3/uL (ref 0.7–4.0)
MCHC: 34.9 g/dL (ref 30.0–36.0)
MCV: 89.4 fl (ref 78.0–100.0)
Monocytes Absolute: 0.5 10*3/uL (ref 0.1–1.0)
Monocytes Relative: 7.6 % (ref 3.0–12.0)
Neutro Abs: 4.6 10*3/uL (ref 1.4–7.7)
Neutrophils Relative %: 67.1 % (ref 43.0–77.0)
Platelets: 158 10*3/uL (ref 150.0–400.0)
RBC: 5.95 Mil/uL — ABNORMAL HIGH (ref 4.22–5.81)
RDW: 13.4 % (ref 11.5–15.5)
WBC: 6.9 10*3/uL (ref 4.0–10.5)

## 2024-03-18 LAB — HEMOGLOBIN A1C: Hgb A1c MFr Bld: 6.8 % — ABNORMAL HIGH (ref 4.6–6.5)

## 2024-03-18 LAB — TESTOSTERONE: Testosterone: 107.13 ng/dL — ABNORMAL LOW (ref 300.00–890.00)

## 2024-03-18 NOTE — Telephone Encounter (Signed)
 FYI Called pt advised per Rebecca Campus R. NP to go and donate blood, pt advised of few Centers in town. Verbalized understanding

## 2024-03-18 NOTE — Progress Notes (Signed)
   Subjective:    Patient ID: Hector Smith, male    DOB: 1976/06/12, 48 y.o.   MRN: [id number]  HPI Here for several issues. First we will follow up on his HTN. His BP has been stable at home. At our last visit 3 months ago, his testosterone  level was high at 1130, his Hgb was high at 19.3, and his A1c was elevated at 6.9%. we decreased the dosing of his testosterone  from every 7 days to every 14 days. He also mentions several days of stuffy head, PND, ST, and coughing up clear sputum. No fever or SOB.    Review of Systems  Constitutional: Negative.   HENT:  Positive for congestion, postnasal drip and sore throat. Negative for ear pain and sinus pressure.   Eyes: Negative.   Respiratory:  Positive for cough. Negative for shortness of breath and wheezing.   Cardiovascular: Negative.        Objective:   Physical Exam Constitutional:      Appearance: Normal appearance.  HENT:     Right Ear: Tympanic membrane, ear canal and external ear normal.     Left Ear: Tympanic membrane, ear canal and external ear normal.     Nose: Nose normal.     Mouth/Throat:     Pharynx: Oropharynx is clear.  Eyes:     Conjunctiva/sclera: Conjunctivae normal.  Cardiovascular:     Rate and Rhythm: Normal rate and regular rhythm.     Pulses: Normal pulses.     Heart sounds: Normal heart sounds.  Pulmonary:     Effort: Pulmonary effort is normal.     Breath sounds: Normal breath sounds.  Lymphadenopathy:     Cervical: No cervical adenopathy.  Neurological:     Mental Status: He is alert.           Assessment & Plan:  He has a viral URI, so he will drink fluids and follow up as needed. His HTN is well controlled. For the lab abnormalities above, we will check a testosterone  level, a CBC, and an A1c today.  Corita Diego, MD

## 2024-03-19 ENCOUNTER — Ambulatory Visit: Payer: Self-pay | Admitting: Family Medicine

## 2024-03-19 ENCOUNTER — Telehealth: Payer: Self-pay | Admitting: Family Medicine

## 2024-03-19 MED ORDER — TESTOSTERONE CYPIONATE 200 MG/ML IM SOLN: 200.0000 mg | INTRAMUSCULAR | 1 refills | Status: DC

## 2024-03-19 NOTE — Telephone Encounter (Signed)
 Done

## 2024-03-20 ENCOUNTER — Ambulatory Visit: Payer: Self-pay

## 2024-03-20 NOTE — Telephone Encounter (Signed)
 FYI Only or Action Required?: FYI only for provider  Patient was last seen in primary care on 03/18/24. Called Nurse Triage reporting Foot Pain. Symptoms began several weeks ago. Interventions attempted: Nothing. Symptoms are: unchanged.  Triage Disposition: See PCP When Office is Open (Within 3 Days)  Patient/caregiver understands and will follow disposition?: Yes  Copied from CRM [id number]. Topic: Clinical - Red Word Triage >> Mar 20, 2024  9:12 AM Crispin Dolphin wrote: Red Word that prompted transfer to Nurse Triage: nerve pain in right foot - toes numb Reason for Disposition  [1] Weakness or numbness in foot or toes AND [2] present > 2 weeks  Answer Assessment - Initial Assessment Questions 1. ONSET: "When did the pain start?"      "A couple of weeks ago"  2. LOCATION: "Where is the pain located?"      Right foot   3. PAIN: "How bad is the pain?"    (Scale 1-10; or mild, moderate, severe)  - MILD (1-3): doesn't interfere with normal activities.   - MODERATE (4-7): interferes with normal activities (e.g., work or school) or awakens from sleep, limping.   - SEVERE (8-10): excruciating pain, unable to do any normal activities, unable to walk.      No pain  4. WORK OR EXERCISE: "Has there been any recent work or exercise that involved this part of the body?"      No  5. CAUSE: "What do you think is causing the foot pain?"     Unsure of cause, but feels like he may have pinched a nerve  6. OTHER SYMPTOMS: "Do you have any other symptoms?" (e.g., leg pain, rash, fever, numbness)     Numbness and nerve pain, can feel toes but feels numb to pinky toe  Protocols used: Foot Pain-A-AH

## 2024-03-21 ENCOUNTER — Encounter: Payer: Self-pay | Admitting: Family Medicine

## 2024-03-21 ENCOUNTER — Ambulatory Visit: Admitting: Family Medicine

## 2024-03-21 VITALS — BP 140/70 | HR 93 | Temp 98.8°F | Wt 301.0 lb

## 2024-03-21 DIAGNOSIS — R200 Anesthesia of skin: Secondary | ICD-10-CM

## 2024-03-21 NOTE — Telephone Encounter (Signed)
He was seen here today

## 2024-03-21 NOTE — Progress Notes (Signed)
   Subjective:    Patient ID: Hector Smith, male    DOB: Apr 02, 1976, 48 y.o.   MRN: [id number]  HPI Here for numbness along the outside of his right 5th toe that began a week ago. He says he was walking while wearing sneakers, and when he quickly turned to his left he felt a "pop" and a sharp pain in the lateral right foot and the 5th toe. The pain went away after a day, but since then he has had numbness and tingling in this area. The area never swelled. He has no pain on walking.    Review of Systems  Constitutional: Negative.   Respiratory: Negative.    Cardiovascular: Negative.   Neurological:  Positive for numbness.       Objective:   Physical Exam Constitutional:      Appearance: Normal appearance.  Cardiovascular:     Rate and Rhythm: Normal rate and regular rhythm.     Pulses: Normal pulses.     Heart sounds: Normal heart sounds.  Pulmonary:     Effort: Pulmonary effort is normal.     Breath sounds: Normal breath sounds.  Musculoskeletal:     Comments: The right foot is normal on appearance. No swelling or tenderness. He has decreased sensation to light touch along the lateral side of the foot and the lateral 5th toe   Neurological:     Mental Status: He is alert.           Assessment & Plan:  He seems to have irritated the nerve running down this area. I reassured him that the numbnes should resolve over the next week or so. He should avoid wearing tight shoes.  Corita Diego, MD

## 2024-04-10 ENCOUNTER — Telehealth: Payer: Self-pay

## 2024-04-10 ENCOUNTER — Telehealth: Payer: Self-pay | Admitting: Family Medicine

## 2024-04-10 MED ORDER — SILDENAFIL CITRATE 100 MG PO TABS: 100.0000 mg | ORAL_TABLET | Freq: Every day | ORAL | 3 refills | Status: DC | PRN

## 2024-04-10 NOTE — Telephone Encounter (Signed)
 Copied from CRM [id number]. Topic: Clinical - Medication Refill >> Apr 10, 2024  9:06 AM Chasity T wrote: Medication: sildenafil  (VIAGRA ) 100 MG tablet   Has the patient contacted their pharmacy? Yes   This is the patient's preferred pharmacy:  [id number] - [place], [place] - [address] AT [facility] [address] [place] [address] Phone: [phone] Fax: [phone]  Is this the correct pharmacy for this prescription? Yes If no, delete pharmacy and type the correct one.   Has the prescription been filled recently? No  Is the patient out of the medication? Yes  Has the patient been seen for an appointment in the last year OR does the patient have an upcoming appointment? Yes  Can we respond through MyChart? Yes  Agent: Please be advised that Rx refills may take up to 3 business days. We ask that you follow-up with your pharmacy.

## 2024-04-10 NOTE — Telephone Encounter (Unsigned)
 Copied from CRM [id number]. Topic: Clinical - Medication Refill >> Apr 10, 2024  9:06 AM Chasity T wrote: Medication: sildenafil  (VIAGRA ) 100 MG tablet   Has the patient contacted their pharmacy? Yes   This is the patient's preferred pharmacy:  [id number] - [place], [place] - [address] AT [facility] [address] [place] [address] Phone: [phone] Fax: [phone]  Is this the correct pharmacy for this prescription? Yes If no, delete pharmacy and type the correct one.   Has the prescription been filled recently? No  Is the patient out of the medication? Yes  Has the patient been seen for an appointment in the last year OR does the patient have an upcoming appointment? Yes  Can we respond through MyChart? Yes  Agent: Please be advised that Rx refills may take up to 3 business days. We ask that you follow-up with your pharmacy.

## 2024-04-10 NOTE — Telephone Encounter (Signed)
 Rx done.

## 2024-04-10 NOTE — Addendum Note (Signed)
 Addended by: CHRISTYNE IDELL LABOR on: 04/10/2024 03:28 PM   Modules accepted: Orders

## 2024-05-01 ENCOUNTER — Ambulatory Visit: Admitting: Family Medicine

## 2024-05-01 ENCOUNTER — Ambulatory Visit: Payer: Self-pay | Admitting: *Deleted

## 2024-05-01 ENCOUNTER — Encounter: Payer: Self-pay | Admitting: Family Medicine

## 2024-05-01 VITALS — BP 142/78 | HR 101 | Temp 98.2°F | Wt 304.0 lb

## 2024-05-01 DIAGNOSIS — T22231A Burn of second degree of right upper arm, initial encounter: Secondary | ICD-10-CM | POA: Diagnosis not present

## 2024-05-01 DIAGNOSIS — Z23 Encounter for immunization: Secondary | ICD-10-CM | POA: Diagnosis not present

## 2024-05-01 NOTE — Telephone Encounter (Signed)
 Patient was seen by PCP this morning at 9:45am.
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
## 2024-05-01 NOTE — Addendum Note (Signed)
 Addended by: LADONNA INOCENTE SAILOR on: 05/01/2024 10:20 AM   Modules accepted: Orders
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
## 2024-05-02 ENCOUNTER — Telehealth: Payer: Self-pay | Admitting: *Deleted

## 2024-05-02 ENCOUNTER — Encounter: Payer: Self-pay | Admitting: Family Medicine

## 2024-05-02 ENCOUNTER — Telehealth: Payer: Self-pay

## 2024-05-02 MED ORDER — CEPHALEXIN 500 MG PO CAPS: 500.0000 mg | ORAL_CAPSULE | Freq: Three times a day (TID) | ORAL | 0 refills | Status: AC

## 2024-05-02 NOTE — Telephone Encounter (Signed)
 Copied from CRM [id number]. Topic: Clinical - Medication Question >> May 02, 2024  9:11 AM Jasmin G wrote: Reason for CRM: Pt. Was in yesterday for an appt. At the clinic, he stated that Dr. Prescribed an antibiotic but that it hasn't been sent to the pharmacy yet, I could not find any info. On this matter but I did inform him about the 24 hr/3 business days turnaround time for medication refills. Pt. Wants to make sure that medication will be put in as he needs it, please message pt. Back through MyChart if possible when this medications is called in.

## 2024-05-02 NOTE — Telephone Encounter (Signed)
 Sorry about that. I just sent in a RX for Cephalexin 

## 2024-05-02 NOTE — Addendum Note (Signed)
 Addended by: JOHNNY SENIOR A on: 05/02/2024 12:13 PM   Modules accepted: Orders

## 2024-05-02 NOTE — Telephone Encounter (Signed)
 Copied from CRM [id number]. Topic: Clinical - Medication Question >> May 01, 2024  2:14 PM Chiquita SQUIBB wrote: Reason for CRM: Patient is calling in stating that he saw Dr. Johnny today and he was suppose to send it over a antibiotic for the patient, the patient stated the pharmacy has not received anything and I do not currently see it on the patients chart.

## 2024-05-03 NOTE — Telephone Encounter (Signed)
 Spoke with pt states tat he picked up Rx from the pharmacy

## 2024-05-03 NOTE — Telephone Encounter (Signed)
 Done

## 2024-05-20 ENCOUNTER — Ambulatory Visit: Payer: Self-pay

## 2024-05-20 NOTE — Telephone Encounter (Signed)
 Noted
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
## 2024-05-21 ENCOUNTER — Encounter: Payer: Self-pay | Admitting: Family Medicine

## 2024-05-21 ENCOUNTER — Ambulatory Visit: Admitting: Family Medicine

## 2024-05-21 VITALS — BP 128/82 | HR 85 | Temp 98.4°F | Resp 12 | Ht 70.5 in | Wt 304.0 lb

## 2024-05-21 DIAGNOSIS — T22231D Burn of second degree of right upper arm, subsequent encounter: Secondary | ICD-10-CM

## 2024-05-21 DIAGNOSIS — L2989 Other pruritus: Secondary | ICD-10-CM

## 2024-05-21 DIAGNOSIS — X12XXXD Contact with other hot fluids, subsequent encounter: Secondary | ICD-10-CM | POA: Diagnosis not present

## 2024-05-21 MED ORDER — SILVER SULFADIAZINE 1 % EX CREA: 1.0000 | TOPICAL_CREAM | Freq: Every day | CUTANEOUS | 0 refills | Status: DC

## 2024-05-21 MED ORDER — TRIAMCINOLONE ACETONIDE 0.1 % EX CREA: 1.0000 | TOPICAL_CREAM | Freq: Two times a day (BID) | CUTANEOUS | 0 refills | Status: DC
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
## 2024-05-22 ENCOUNTER — Other Ambulatory Visit: Payer: Self-pay

## 2024-05-22 DIAGNOSIS — L2989 Other pruritus: Secondary | ICD-10-CM

## 2024-05-22 DIAGNOSIS — T22231D Burn of second degree of right upper arm, subsequent encounter: Secondary | ICD-10-CM

## 2024-05-22 MED ORDER — SILVER SULFADIAZINE 1 % EX CREA: 1.0000 | TOPICAL_CREAM | Freq: Every day | CUTANEOUS | 0 refills | Status: AC

## 2024-05-22 MED ORDER — TRIAMCINOLONE ACETONIDE 0.1 % EX CREA: 1.0000 | TOPICAL_CREAM | Freq: Two times a day (BID) | CUTANEOUS | 0 refills | Status: AC

## 2024-05-23 NOTE — Telephone Encounter (Signed)
I sent this in yesterday

## 2024-06-03 ENCOUNTER — Ambulatory Visit: Admitting: Family Medicine

## 2024-06-10 ENCOUNTER — Telehealth: Payer: Self-pay

## 2024-06-10 ENCOUNTER — Ambulatory Visit: Admitting: Family Medicine

## 2024-06-10 ENCOUNTER — Encounter: Payer: Self-pay | Admitting: Family Medicine

## 2024-06-10 ENCOUNTER — Other Ambulatory Visit (HOSPITAL_COMMUNITY): Payer: Self-pay

## 2024-06-10 VITALS — BP 128/76 | HR 65 | Temp 98.2°F | Wt 303.0 lb

## 2024-06-10 DIAGNOSIS — E1165 Type 2 diabetes mellitus with hyperglycemia: Secondary | ICD-10-CM | POA: Diagnosis not present

## 2024-06-10 MED ORDER — OZEMPIC (0.25 OR 0.5 MG/DOSE) 2 MG/3ML ~~LOC~~ SOPN
0.2500 mg | PEN_INJECTOR | SUBCUTANEOUS | 5 refills | Status: DC
Start: 2024-06-10 — End: 2024-07-30

## 2024-06-10 NOTE — Telephone Encounter (Signed)
 Pharmacy Patient Advocate Encounter   Received notification from Latent that prior authorization for Ozempic  (0.25 or 0.5 MG/DOSE) 2MG /3ML pen-injectors  is due for renewal.   Insurance verification completed.   The patient is insured through [facility].  Action: PA required; PA submitted to above mentioned insurance via Latent Key/confirmation #/EOC [facility] Status is pending

## 2024-06-10 NOTE — Progress Notes (Signed)
   Subjective:    Patient ID: Hector Smith, male    DOB: Jun 08, 1976, 48 y.o.   MRN: [id number]  HPI Here asking for help with weight loss. He has type 2 diabetes, and his last A1c on 03-18-24 was 6.8%. his BMI today is 42.86. he has tried diet and exercise with minimal success.    Review of Systems  Constitutional: Negative.   Respiratory: Negative.    Cardiovascular: Negative.        Objective:   Physical Exam Constitutional:      Appearance: He is obese.  Cardiovascular:     Rate and Rhythm: Normal rate and regular rhythm.     Pulses: Normal pulses.     Heart sounds: Normal heart sounds.  Pulmonary:     Effort: Pulmonary effort is normal.     Breath sounds: Normal breath sounds.  Neurological:     Mental Status: He is alert.           Assessment & Plan:  Type 2 diabetes with morbid obesity. He will try Ozempic  0.25 mg weekly. Follow up in 4 weeks.  Garnette Olmsted, MD

## 2024-06-11 ENCOUNTER — Other Ambulatory Visit (HOSPITAL_BASED_OUTPATIENT_CLINIC_OR_DEPARTMENT_OTHER): Payer: Self-pay

## 2024-06-11 ENCOUNTER — Other Ambulatory Visit (HOSPITAL_COMMUNITY): Payer: Self-pay

## 2024-06-11 NOTE — Telephone Encounter (Signed)
 Pharmacy Patient Advocate Encounter  Received notification from [facility] that Prior Authorization for Ozempic  (0.25 or 0.5 MG/DOSE) 2MG /3ML pen-injectors has been APPROVED from 06/11/2024 to 06/11/2027. Unable to obtain price due to refill too soon rejection, last fill date 08/26/20258 next available fill date09/16/2025   PA #/Case ID/Reference #: [id number]

## 2024-07-14 ENCOUNTER — Other Ambulatory Visit: Payer: Self-pay | Admitting: Family Medicine

## 2024-07-30 ENCOUNTER — Telehealth: Payer: Self-pay

## 2024-07-30 MED ORDER — OZEMPIC (0.25 OR 0.5 MG/DOSE) 2 MG/3ML ~~LOC~~ SOPN: 0.5000 mg | PEN_INJECTOR | SUBCUTANEOUS | 5 refills | Status: DC

## 2024-07-30 NOTE — Telephone Encounter (Signed)
I sent in the 0.5 mg dose

## 2024-07-30 NOTE — Addendum Note (Signed)
 Addended by: JOHNNY SENIOR A on: 07/30/2024 04:58 PM   Modules accepted: Orders

## 2024-07-30 NOTE — Telephone Encounter (Signed)
 Copied from CRM [id number]. Topic: Clinical - Medication Question >> Jul 29, 2024 12:38 PM Aleatha C wrote: Reason for CRM: Patient been getting 2mg  pens of his Semaglutide ,0.25 or 0.5MG /DOS, (OZEMPIC , 0.25 OR 0.5 MG/DOSE,) 2 MG/3ML SOPN and wants to know how does the pen work because he only has 2 shots left and would like a call to discuss how the dosage works call back # [phone]

## 2024-08-01 NOTE — Telephone Encounter (Signed)
 Pt.notified

## 2024-08-09 ENCOUNTER — Other Ambulatory Visit: Payer: Self-pay

## 2024-08-09 ENCOUNTER — Encounter: Payer: Self-pay | Admitting: Family Medicine

## 2024-08-09 ENCOUNTER — Ambulatory Visit: Payer: Self-pay | Admitting: Family Medicine

## 2024-08-09 ENCOUNTER — Ambulatory Visit: Admitting: Family Medicine

## 2024-08-09 VITALS — BP 132/78 | HR 87 | Temp 98.2°F | Wt 294.0 lb

## 2024-08-09 DIAGNOSIS — Z7985 Long-term (current) use of injectable non-insulin antidiabetic drugs: Secondary | ICD-10-CM | POA: Diagnosis not present

## 2024-08-09 DIAGNOSIS — E1165 Type 2 diabetes mellitus with hyperglycemia: Secondary | ICD-10-CM

## 2024-08-09 LAB — HEMOGLOBIN A1C: Hgb A1c MFr Bld: 6.2 % (ref 4.6–6.5)

## 2024-08-09 MED ORDER — SILDENAFIL CITRATE 100 MG PO TABS: 100.0000 mg | ORAL_TABLET | Freq: Every day | ORAL | 3 refills | Status: AC | PRN

## 2024-08-09 MED ORDER — SEMAGLUTIDE (1 MG/DOSE) 4 MG/3ML ~~LOC~~ SOPN: 1.0000 mg | PEN_INJECTOR | SUBCUTANEOUS | 3 refills | Status: DC

## 2024-08-09 NOTE — Progress Notes (Signed)
   Subjective:    Patient ID: Hector Smith, male    DOB: [birth year], 48 y.o.   MRN: [id number]  HPI Here to follow up after starting on Ozempic  2 months ago. He just started on the 1 mg per week dose. He feels well and has no side effects.    Review of Systems  Constitutional: Negative.   Respiratory: Negative.    Cardiovascular: Negative.        Objective:   Physical Exam Constitutional:      Appearance: He is obese.  Cardiovascular:     Rate and Rhythm: Normal rate and regular rhythm.     Pulses: Normal pulses.     Heart sounds: Normal heart sounds.  Pulmonary:     Effort: Pulmonary effort is normal.     Breath sounds: Normal breath sounds.  Neurological:     Mental Status: He is alert.           Assessment & Plan:  Type 2 diabetes and obesity. We will check another A1c today, and we will continue to titrate up on the dose. Garnette Olmsted, MD

## 2024-08-31 ENCOUNTER — Encounter: Payer: Self-pay | Admitting: Family Medicine

## 2024-09-02 ENCOUNTER — Other Ambulatory Visit: Payer: Self-pay

## 2024-09-02 MED ORDER — SEMAGLUTIDE (2 MG/DOSE) 8 MG/3ML ~~LOC~~ SOPN: 2.0000 mg | PEN_INJECTOR | SUBCUTANEOUS | 5 refills | Status: DC

## 2024-09-02 NOTE — Telephone Encounter (Signed)
 I sent in a RX for the 2 mg dose

## 2024-11-14 ENCOUNTER — Telehealth: Payer: Self-pay

## 2024-11-14 MED ORDER — TIRZEPATIDE 10 MG/0.5ML ~~LOC~~ SOAJ: 10.0000 mg | SUBCUTANEOUS | 5 refills | Status: AC

## 2024-11-14 NOTE — Telephone Encounter (Signed)
 Copied from CRM [id number]. Topic: Clinical - Medication Question >> Nov 13, 2024  4:44 PM Drema MATSU wrote: Reason for CRM: Pt wants to switch from Ozempic  to Mounjaro .

## 2024-11-14 NOTE — Telephone Encounter (Signed)
 I sent in Mounjaro  10 mg weekly. We can increase the dose in 4 weeks if needed

## 2024-11-14 NOTE — Addendum Note (Signed)
 Addended by: JOHNNY SENIOR A on: 11/14/2024 12:36 PM   Modules accepted: Orders

## 2024-11-14 NOTE — Telephone Encounter (Signed)
 Pt advised of of new Rx for Mounjaro  voiced understanding
